# Patient Record
Sex: Male | Born: 1970 | Race: White | Hispanic: No | Marital: Married | Smoking: Never smoker
Health system: Southern US, Community
[De-identification: ages and names within clinical notes are randomized; demographics above are authoritative.]

## PROBLEM LIST (undated history)

## (undated) DIAGNOSIS — G4733 Obstructive sleep apnea (adult) (pediatric): Secondary | ICD-10-CM

## (undated) DIAGNOSIS — Z8711 Personal history of peptic ulcer disease: Secondary | ICD-10-CM

## (undated) DIAGNOSIS — G8929 Other chronic pain: Secondary | ICD-10-CM

## (undated) DIAGNOSIS — M549 Dorsalgia, unspecified: Secondary | ICD-10-CM

## (undated) DIAGNOSIS — N200 Calculus of kidney: Secondary | ICD-10-CM

## (undated) DIAGNOSIS — Z8719 Personal history of other diseases of the digestive system: Secondary | ICD-10-CM

## (undated) DIAGNOSIS — E785 Hyperlipidemia, unspecified: Secondary | ICD-10-CM

## (undated) HISTORY — DX: Obstructive sleep apnea (adult) (pediatric): G47.33

## (undated) HISTORY — DX: Dorsalgia, unspecified: M54.9

## (undated) HISTORY — PX: VASECTOMY: SHX75

## (undated) HISTORY — DX: Personal history of peptic ulcer disease: Z87.11

## (undated) HISTORY — PX: OTHER SURGICAL HISTORY: SHX169

## (undated) HISTORY — DX: Other chronic pain: G89.29

## (undated) HISTORY — DX: Hyperlipidemia, unspecified: E78.5

## (undated) HISTORY — DX: Personal history of other diseases of the digestive system: Z87.19

---

## 2005-08-23 ENCOUNTER — Encounter: Admission: RE | Admit: 2005-08-23 | Discharge: 2005-08-23 | Payer: Self-pay | Admitting: Unknown Physician Specialty

## 2010-01-12 ENCOUNTER — Ambulatory Visit: Payer: Self-pay | Admitting: Urology

## 2011-06-20 ENCOUNTER — Ambulatory Visit: Payer: Self-pay | Admitting: Emergency Medicine

## 2011-09-25 ENCOUNTER — Ambulatory Visit: Payer: Self-pay | Admitting: Emergency Medicine

## 2011-10-18 ENCOUNTER — Encounter: Payer: Self-pay | Admitting: Pulmonary Disease

## 2011-10-21 ENCOUNTER — Encounter: Payer: Self-pay | Admitting: Pulmonary Disease

## 2011-10-21 ENCOUNTER — Ambulatory Visit (INDEPENDENT_AMBULATORY_CARE_PROVIDER_SITE_OTHER): Payer: BC Managed Care – PPO | Admitting: Pulmonary Disease

## 2011-10-21 VITALS — BP 130/100 | HR 80 | Temp 98.5°F | Ht 69.0 in | Wt 189.0 lb

## 2011-10-21 DIAGNOSIS — G4733 Obstructive sleep apnea (adult) (pediatric): Secondary | ICD-10-CM

## 2011-10-21 NOTE — Assessment & Plan Note (Signed)
The patient has moderate central and obstructive sleep apnea by his recent sleep study, and this could easily explain his frequent awakenings and daytime symptoms.  However, it is unclear whether this is the cause of his sleep onset issues.  I have had a long discussion with him about sleep apnea, including its impact on his cardiovascular health and quality of life.  At this point, I wouldn't recommend aggressive treatment with CPAP, to see if he has significant improvement in his symptoms.  The patient is agreeable to this approach. I will set the patient up on cpap at a moderate pressure level to allow for desensitization, and will troubleshoot the device over the next 4-6weeks if needed.  The pt is to call me if having issues with tolerance.  Will then optimize the pressure once patient is able to wear cpap on a consistent basis.

## 2011-10-21 NOTE — Patient Instructions (Signed)
Will start on cpap at a moderate pressure level.  Please call if tolerance issues. followup with me in 5 weeks.

## 2011-10-21 NOTE — Progress Notes (Signed)
Subjective:    Patient ID: UNNAMED ZEIEN, male    DOB: 16-Aug-1971, 41 y.o.   MRN: [id number]  HPI The patient is a 41 year old male who been asked to see for management of obstructive sleep apnea.  He recently underwent nocturnal polysomnography, where he was found to have an AHI of 22 events per hour.  He underwent a recent CPAP titration, however an optimal pressure was never really reached.  The patient has had long-standing issues with sleep onset, and has been taking a sleep aid to help with this.  His wife has noted that he snores louder if he takes a sleeping pill.  She has also noted abnormal noises in his breathing during sleep, but has not commented specifically on an abnormal breathing pattern.  The patient has frequent awakenings at night, and is not usually rested in the mornings upon arising.  He also thinks that chronic back pain may be an issue for him, and will need to take pain medications at times.  The patient denies definite sleepiness during the day, but admits that his alertness and focus are not acceptable at times.  He denies any sleepiness in the evenings while watching television, and has no sleepiness with driving.  He states that his weight is neutral over the last 2 years.  Sleep Questionnaire: What time do you typically go to bed?( Between what hours) 10:30 to 11 pm How long does it take you to fall asleep? ? How many times during the night do you wake up? 5 What time do you get out of bed to start your day? 0630 Do you drive or operate heavy machinery in your occupation? No How much has your weight changed (up or down) over the past two years? (In pounds) 0 oz (0 kg) Have you ever had a sleep study before? Yes If yes, location of study? [facility] If yes, date of study? 06/2011 and 09/2011 Do you currently use CPAP? No Do you wear oxygen at any time? No    Review of Systems  Constitutional: Negative for fever and unexpected weight change.  HENT: Negative for ear pain,  nosebleeds, congestion, sore throat, rhinorrhea, sneezing, trouble swallowing, dental problem, postnasal drip and sinus pressure.   Eyes: Negative for redness and itching.  Respiratory: Negative for cough, chest tightness, shortness of breath and wheezing.   Cardiovascular: Negative for palpitations and leg swelling.  Gastrointestinal: Negative for nausea and vomiting.  Genitourinary: Negative for dysuria.  Musculoskeletal: Negative for joint swelling.  Skin: Negative for rash.  Neurological: Negative for headaches.  Hematological: Does not bruise/bleed easily.  Psychiatric/Behavioral: Negative for dysphoric mood. The patient is not nervous/anxious.        Objective:   Physical Exam Constitutional:  Well developed, no acute distress  HENT:  Right nostril patent without discharge, but deviated septum to left with partial obstruction  Oropharynx without exudate, palate and uvula are normal  Eyes:  Perrla, eomi, no scleral icterus  Neck:  No JVD, no TMG  Cardiovascular:  Normal rate, regular rhythm, no rubs or gallops.  No murmurs        Intact distal pulses  Pulmonary :  Normal breath sounds, no stridor or respiratory distress   No rales, rhonchi, or wheezing  Abdominal:  Soft, nondistended, bowel sounds present.  No tenderness noted.   Musculoskeletal:  No lower extremity edema noted.  Lymph Nodes:  No cervical lymphadenopathy noted  Skin:  No cyanosis noted  Neurologic:  Alert, appropriate, moves all 4  extremities without obvious deficit.         Assessment & Plan:

## 2011-11-25 ENCOUNTER — Ambulatory Visit (INDEPENDENT_AMBULATORY_CARE_PROVIDER_SITE_OTHER): Payer: BC Managed Care – PPO | Admitting: Pulmonary Disease

## 2011-11-25 ENCOUNTER — Encounter: Payer: Self-pay | Admitting: Pulmonary Disease

## 2011-11-25 VITALS — BP 136/92 | HR 81 | Temp 98.5°F | Ht 69.0 in | Wt 188.0 lb

## 2011-11-25 DIAGNOSIS — G4733 Obstructive sleep apnea (adult) (pediatric): Secondary | ICD-10-CM

## 2011-11-25 NOTE — Progress Notes (Signed)
  Subjective:    Patient ID: Donald Tran, male    DOB: Oct 27, 1970, 41 y.o.   MRN: [id number]  HPI The pt comes in today for f/u of his known osa.  He is wearing cpap compliantly, and reports no significant issues with mask fit or pressure.  He is unsure if this is helping his sleep, and is still having awakenings at night.  However, we have not optimized his pressure as of yet.    Review of Systems  Constitutional: Negative for fever and unexpected weight change.  HENT: Positive for congestion, sore throat, sneezing and sinus pressure. Negative for ear pain, nosebleeds, rhinorrhea, trouble swallowing, dental problem and postnasal drip.   Eyes: Negative for redness and itching.  Respiratory: Negative for cough, chest tightness, shortness of breath and wheezing.   Cardiovascular: Negative for palpitations and leg swelling.  Gastrointestinal: Negative for nausea and vomiting.  Genitourinary: Negative for dysuria.  Musculoskeletal: Negative for joint swelling.  Skin: Negative for rash.  Neurological: Positive for headaches.  Hematological: Does not bruise/bleed easily.  Psychiatric/Behavioral: Negative for dysphoric mood. The patient is not nervous/anxious.        Objective:   Physical Exam Wd male in and No skin breakdown or pressure necrosis from cpap mask LE without edema, no cyanosis  Alert, does not appear sleepy, moves all 4.        Assessment & Plan:

## 2011-11-25 NOTE — Patient Instructions (Signed)
Will optimize your pressure on auto setting for the next few weeks.  Will let you know the results. Hopefully, this will resolve your issue with nocturnal awakenings Ask your wife about kicking during the night If you are doing well on cpap, will see you back in 6mos.  If persistent symptoms, will need to discuss further.

## 2011-11-25 NOTE — Assessment & Plan Note (Signed)
The pt is wearing cpap compliantly, but continues to have awakenings during the night.  This may be related to break thru events since we have yet optimized his pressure.  Will do this on the auto setting for the next few weeks, and I will let him know his pressure.  Hopefully, this will resolve his awakenings at night.  I have also asked him to see if wife has noticed leg kicks during the night.  He did have leg jerks noted on his original sleep study.

## 2011-12-08 ENCOUNTER — Other Ambulatory Visit: Payer: Self-pay | Admitting: Pulmonary Disease

## 2011-12-23 ENCOUNTER — Other Ambulatory Visit: Payer: Self-pay | Admitting: Pulmonary Disease

## 2011-12-23 ENCOUNTER — Telehealth: Payer: Self-pay | Admitting: Pulmonary Disease

## 2011-12-23 DIAGNOSIS — G4733 Obstructive sleep apnea (adult) (pediatric): Secondary | ICD-10-CM

## 2011-12-23 NOTE — Telephone Encounter (Signed)
I spoke with patient about results and he verbalized understanding and had no questions. Pt aware order has been sent

## 2011-12-23 NOTE — Telephone Encounter (Signed)
Pt requesting results of download from week of 12-09-10. Cpap was placed on auto with download in2 weeks. Please advise.Carron Curie, CMA

## 2011-12-23 NOTE — Telephone Encounter (Signed)
Sent order to [facility] to set his machine on 11cm.

## 2011-12-26 ENCOUNTER — Emergency Department: Payer: Self-pay | Admitting: Emergency Medicine

## 2011-12-26 LAB — URINALYSIS, COMPLETE
Bilirubin,UR: NEGATIVE
Glucose,UR: NEGATIVE mg/dL (ref 0–75)
Leukocyte Esterase: NEGATIVE
Ph: 9 (ref 4.5–8.0)
Squamous Epithelial: NONE SEEN

## 2011-12-27 ENCOUNTER — Ambulatory Visit: Payer: Self-pay | Admitting: Urology

## 2012-04-15 ENCOUNTER — Telehealth: Payer: Self-pay | Admitting: Pulmonary Disease

## 2012-04-15 NOTE — Telephone Encounter (Signed)
Need specific reasons why he can't wear for more than 30 min (ie..mask issue, pressure issue, humidity issue, etc)

## 2012-04-15 NOTE — Telephone Encounter (Signed)
LMTCB

## 2012-04-15 NOTE — Telephone Encounter (Signed)
Spoke with patient-states in the beginning he was able to wear CPAP without any troubles. Now, he is unable to wear more than 30 minutes at night; Also, has hard time with tubing when moving around in the bed; I suggested patient contact [facility] to see about bedside pole to help keep tubing off of him at night. KC, please advise if any other suggestions. Thanks.

## 2012-04-15 NOTE — Telephone Encounter (Signed)
Spoke with pt and notified of recs per [facility]. He verbalized understanding and states nothing further needed. Will take CPAP by [facility] today.

## 2012-04-15 NOTE — Telephone Encounter (Signed)
I guess see if DME can help with tube issues, and see if gets better.  Have pt call us if continues.

## 2012-04-15 NOTE — Telephone Encounter (Signed)
KC, pt states that the tubing is in the way; no other explanations as to why he cant wear it. States no issues with pressure,etc.

## 2012-06-01 ENCOUNTER — Encounter: Payer: Self-pay | Admitting: Pulmonary Disease

## 2012-06-01 ENCOUNTER — Ambulatory Visit (INDEPENDENT_AMBULATORY_CARE_PROVIDER_SITE_OTHER): Payer: BC Managed Care – PPO | Admitting: Pulmonary Disease

## 2012-06-01 VITALS — BP 132/80 | HR 78 | Temp 98.3°F | Ht 69.0 in | Wt 187.6 lb

## 2012-06-01 DIAGNOSIS — G4733 Obstructive sleep apnea (adult) (pediatric): Secondary | ICD-10-CM

## 2012-06-01 NOTE — Patient Instructions (Addendum)
Take the mirapex prescribed by your primary (0.125mg ) after DINNER each night.  If helps, but only partially, increase to 2 after dinner. Let me know in 3 weeks about your response to treatment, and we can discuss further options from there.

## 2012-06-01 NOTE — Assessment & Plan Note (Signed)
The patient continues to have difficulty with CPAP compliance because of frequent awakenings during the night.  He feels very strongly that he does not awaken, he would wear CPAP the entirety of the night.  His sleep study did show moderate numbers of periodic limb movements, and his wife has noted this at home.  He has been given a prescription for Mirapex by his primary M.D., and I think he should try this to see if that helps with awakenings.  I have also stressed to him the importance of trying to avoid nightly sedative hypnotics in order to initiate and maintain sleep.  If he sees significant improvement in his sleep duration with a dopamine agonist, I would then reintroduce CPAP.  If he continues to have issues with tolerance, I would then consider a dental appliance.

## 2012-06-01 NOTE — Progress Notes (Signed)
  Subjective:    Patient ID: Donald Tran, male    DOB: 07-Jan-1971, 41 y.o.   MRN: [id number]  HPI The patient comes in today for followup of his known moderate obstructive sleep apnea.  His pressure has been optimized, but despite this, he continues to have issues with compliance.  He currently is not wearing CPAP, and attributes this to frequent awakenings during the night.  We had been unable to pinpoint the reason for this, but I had asked him at the last visit to discuss with his wife the possibility of limb movements.  He did have these on his sleep study, and his wife verified he does have these during the night.  He has been given a prescription for a dopamine agonist by his primary care physician, but has not started the medication.  He is continuing to take Lunesta almost nightly to try and initiate and maintain sleep, but he has been breaking the tablets up into quarters.   Review of Systems  Constitutional: Negative for fever and unexpected weight change.  HENT: Negative for ear pain, nosebleeds, congestion, sore throat, rhinorrhea, sneezing, trouble swallowing, dental problem, postnasal drip and sinus pressure.   Eyes: Negative for redness and itching.  Respiratory: Negative for cough, chest tightness, shortness of breath and wheezing.   Cardiovascular: Negative for palpitations and leg swelling.  Gastrointestinal: Negative for nausea and vomiting.  Genitourinary: Negative for dysuria.  Musculoskeletal: Negative for joint swelling.  Skin: Negative for rash.  Neurological: Negative for headaches.  Hematological: Does not bruise/bleed easily.  Psychiatric/Behavioral: Negative for dysphoric mood. The patient is not nervous/anxious.        Objective:   Physical Exam Well-developed male in no acute distress No skin breakdown or pressure necrosis from the CPAP mask Lower extremities without edema, no cyanosis Alert, does not appear to be sleepy, moves all 4  extremities.       Assessment & Plan:

## 2012-06-30 ENCOUNTER — Telehealth: Payer: Self-pay | Admitting: Pulmonary Disease

## 2012-06-30 NOTE — Telephone Encounter (Signed)
Spoke with patient.  He would like Dr. Shelle Iron to know that since starting Mirapex 0.125mg  for the restless leg symptoms, he is feeling much better.  Feels like medication has really helped him, he is getting much more sleep and has even come off Lunesta.  Nothing further needed from patient at this time.  Will forward to Dr. Shelle Iron.

## 2012-06-30 NOTE — Telephone Encounter (Signed)
ATC patient, no answer. LMOMTCB x1

## 2012-07-01 NOTE — Telephone Encounter (Signed)
Pt is aware that [facility] would like him to try to use his CPAP now that he is sleeping better. He knows to call us if he has any issues or questions.

## 2012-07-01 NOTE — Telephone Encounter (Signed)
LMOMTCB x 1 

## 2012-07-01 NOTE — Telephone Encounter (Signed)
Returning call can be reached at [phone].Donald Tran

## 2012-07-01 NOTE — Telephone Encounter (Signed)
Let pt know that I am glad this has helped.  I would like for him to try to get back on cpap now that he is sleeping better.  Let me know if issues.

## 2012-09-04 ENCOUNTER — Telehealth: Payer: Self-pay | Admitting: Pulmonary Disease

## 2012-09-04 NOTE — Telephone Encounter (Signed)
See if he would be willing to try cpap one more time on the auto setting.  Then if he failed, we would know this is not a viable therapy for him and could try dental appliance?  The other option is to discontinue cpap now and look into dental appliance.  See what pt thinks,

## 2012-09-04 NOTE — Telephone Encounter (Signed)
Called and spoke with pt He states that he can not tolerate the CPAP for for than approx 2 hours He states that he falls asleep fine (w/o any sleep aid) and RLS better on mirapex However, he wakes up 2 hrs later and can not go back to sleep until he takes off his mask He states "it's just awkward" Denied any problems with pressure and states he is using "half mask with nasal pillows" KC, please advise thanks!

## 2012-09-07 ENCOUNTER — Other Ambulatory Visit: Payer: Self-pay | Admitting: Pulmonary Disease

## 2012-09-07 DIAGNOSIS — G4733 Obstructive sleep apnea (adult) (pediatric): Secondary | ICD-10-CM

## 2012-09-07 NOTE — Telephone Encounter (Signed)
Pt returned call. Kathleen W Perdue  

## 2012-09-07 NOTE — Telephone Encounter (Signed)
I spoke with pt and he states he would be willing to try auto setting again and see how he does. Please advise KC thanks

## 2012-09-07 NOTE — Telephone Encounter (Signed)
Order sent to [facility].  

## 2012-09-07 NOTE — Telephone Encounter (Signed)
lmomtcb  

## 2012-09-08 NOTE — Telephone Encounter (Signed)
Called, spoke with pt. Informed him order was placed to have cpap set on auto, and he will receive call from DME to get this set up.  Advised to pls call back if he has any questions or concerns.  He verbalized understanding and voiced no further questions or concerns at this time.

## 2012-11-13 ENCOUNTER — Telehealth: Payer: Self-pay | Admitting: Pulmonary Disease

## 2012-11-13 NOTE — Telephone Encounter (Signed)
LMTCB

## 2012-11-13 NOTE — Telephone Encounter (Signed)
Pt returned triage's call & can be reached after 4:30 today.  Antionette Fairy

## 2012-11-16 NOTE — Telephone Encounter (Signed)
Spoke with pt He states unable to use CPAP He denies any issue with mask, or pressure He states that he thinks it is b/c he is a light sleeper and before he always took some sort of sleep aid to help He tried using machine only a few nights, and could not ever fall asleep due to the simple fact that he had the machine on KC, please advise recs thanks!

## 2012-11-16 NOTE — Telephone Encounter (Signed)
LMTCB

## 2012-11-16 NOTE — Telephone Encounter (Signed)
I don't mind given him a two week treatment of something to help with sleep onset, but he cannot stay on it. Trazodone 50mg  one about an hour before bedtime.  #14, no fills.

## 2012-11-16 NOTE — Telephone Encounter (Signed)
Pt returned call. Donald Tran  

## 2012-11-17 MED ORDER — TRAZODONE HCL 50 MG PO TABS: ORAL_TABLET | ORAL | Status: DC

## 2012-11-17 NOTE — Telephone Encounter (Signed)
Spoke with pt and informed of Dr Shelle Iron instructions regarding sleep aid and that med was sent to pharmacy.

## 2012-11-20 ENCOUNTER — Telehealth: Payer: Self-pay | Admitting: Pulmonary Disease

## 2012-11-20 NOTE — Telephone Encounter (Signed)
Let him know that I do NOT prescribe ambien or lunesta in my practice except on rare occasions.  These are not good medications.   He needs to stay on his meds for his restless legs, and let us know if this is not completely controlled He also needs to stay on cpap.  Both of these conditions can cause insomnia. Finally, he may just have insomnia, and this is best treated with behavioral therapy from a psychologist instead of sleeping medications that lead to addiction.  It sounds like he would benefit from an ov to discuss some of this.

## 2012-11-20 NOTE — Telephone Encounter (Signed)
Pt states that since starting Trazodone he hasn't slept at all. He is wanting to switch back to [facility] or Ambien. Pt did mention that Ambien is cheaper and would like to try that one first.  Please advise KC. Thanks.

## 2012-11-20 NOTE — Telephone Encounter (Signed)
Patient states that he stopped his RLS meds a few weeks ago and is wanting to use Trazodone that he has from previous visit with [facility]. Per KC, no Trazodone at this time. Restart Mirapex--stay on CPAP If any breakthru symptoms let us know.  Pt aware of recs per [facility] about further treatment of insomnia/RLS Patient scheduled for ROV 11/24/12 at 915

## 2012-11-24 ENCOUNTER — Telehealth: Payer: Self-pay | Admitting: Pulmonary Disease

## 2012-11-24 ENCOUNTER — Encounter: Payer: Self-pay | Admitting: Pulmonary Disease

## 2012-11-24 ENCOUNTER — Ambulatory Visit (INDEPENDENT_AMBULATORY_CARE_PROVIDER_SITE_OTHER): Payer: BC Managed Care – PPO | Admitting: Pulmonary Disease

## 2012-11-24 VITALS — BP 128/72 | HR 71 | Temp 98.2°F | Ht 69.0 in | Wt 193.6 lb

## 2012-11-24 DIAGNOSIS — G4733 Obstructive sleep apnea (adult) (pediatric): Secondary | ICD-10-CM

## 2012-11-24 NOTE — Progress Notes (Signed)
  Subjective:    Patient ID: Donald Tran, male    DOB: Aug 19, 1971, 42 y.o.   MRN: [id number]  HPI Patient comes in today for followup of his obstructive sleep apnea and restless leg syndrome.  He has been having difficulties tolerating CPAP, and also sleeping without Ambien or Lunesta.  At the last visit, he was to take his Mirapex for his RLS, while wearing CPAP, and had issues with CPAP tolerance.  He was placed on the automatic setting, but continued to have issues.  He was given a short trial of trazodone, but for some reason he stopped taking his Mirapex, and still could not sleep with CPAP.  He comes in today where he is not able to wear his CPAP consistently, but I had instructed him by telephone to get back on his Mirapex.  His limb movements were completely controlled on this medication.  Despite this, he continues to have issues with CPAP, and believes it is the actual mask on his face that is the issue.  He is currently using nasal pillows.   Review of Systems  Constitutional: Negative for fever and unexpected weight change.  HENT: Negative for ear pain, nosebleeds, congestion, sore throat, rhinorrhea, sneezing, trouble swallowing, dental problem, postnasal drip and sinus pressure.   Eyes: Negative for redness and itching.  Respiratory: Negative for cough, chest tightness, shortness of breath and wheezing.   Cardiovascular: Negative for palpitations and leg swelling.  Gastrointestinal: Negative for nausea and vomiting.  Genitourinary: Negative for dysuria.  Musculoskeletal: Negative for joint swelling.  Skin: Negative for rash.  Neurological: Negative for headaches.  Hematological: Does not bruise/bleed easily.  Psychiatric/Behavioral: Negative for sleep disturbance ( improvement in RLS with treatment of Mirapex) and dysphoric mood. The patient is not nervous/anxious.        Objective:   Physical Exam Overweight male in no acute distress Nose without purulent discharge  noted No skin breakdown or pressure necrosis from the CPAP mask Neck without thyromegaly or lymphadenopathy Lower extremities without edema, cyanosis Alert and oriented, moves all 4 extremities.       Assessment & Plan:

## 2012-11-24 NOTE — Patient Instructions (Addendum)
Stay on cpap with the auto setting Stay on mirapex since it is helping your limb movements. Will try 2 weeks of trazodone while staying on mirapex and cpap. Please call me in 2 weeks with update.

## 2012-11-24 NOTE — Telephone Encounter (Signed)
Spoke with pt He is asking if needs to keep appt today with [facility] I advised yes, that way [facility] can answer all of his questions and they can talk about OSA tx since he is doing poorly with CPAP Pt verbalized understanding and will keep the appt today Nothing further needed

## 2012-11-24 NOTE — Assessment & Plan Note (Signed)
The patient continues to have issues with wearing CPAP, but did not stay on his medication for RLS while he was using trazodone to help him with CPAP tolerance.  He is now back on his Mirapex, and sees a big difference in his leg movement and sleep.  However, he is not able to tolerate CPAP through the night despite this.  Will try trazodone again while he is taking Mirapex and staying on CPAP.  If this does not work, we may have to consider going to a dental appliance.

## 2012-12-14 ENCOUNTER — Telehealth: Payer: Self-pay | Admitting: Pulmonary Disease

## 2012-12-14 MED ORDER — TRAZODONE HCL 50 MG PO TABS: 50.0000 mg | ORAL_TABLET | Freq: Every day | ORAL | Status: DC

## 2012-12-14 NOTE — Telephone Encounter (Signed)
Pt is aware of KC recommendation. Rx has been sent in.

## 2012-12-14 NOTE — Telephone Encounter (Signed)
Patient returning call.

## 2012-12-14 NOTE — Telephone Encounter (Signed)
I spoke with pt and he stated he tried the trazodone w/ the mirapex x 2 weeks. He was actually able to sleep with his CPAP x 7-8 hrs a night. He finished the trazodone last Tuesday. Since he has not been able to tolerate the CPAP. Only have been able to use it x1-3.5 hrs a night. Please advise KC thanks

## 2012-12-14 NOTE — Telephone Encounter (Signed)
LMTCB

## 2012-12-14 NOTE — Telephone Encounter (Signed)
I am willing to give him a little longer on trazodone to adapt further to cpap.  Ok to call in trazodone 50mg  one at hs prn  #14, no fills. Tell the pt he needs to keep trying the cpap off a sleeping medication (but on mirapex))

## 2013-01-21 ENCOUNTER — Telehealth: Payer: Self-pay | Admitting: Pulmonary Disease

## 2013-01-21 DIAGNOSIS — G4733 Obstructive sleep apnea (adult) (pediatric): Secondary | ICD-10-CM

## 2013-01-21 NOTE — Telephone Encounter (Signed)
Spoke with pt He states that he is still unable to tolerate CPAP despite taking trazodone He can fall asleep, but not with his CPAP on Please advise if any further recs thanks

## 2013-01-22 NOTE — Telephone Encounter (Signed)
Spoke with patient, made him aware of recs per [facility] Patient verbalized understanding and would like to proceed with dental appliance Order has been placed, patient aware and nothing further needed at this time

## 2013-01-22 NOTE — Telephone Encounter (Signed)
Let pt know I suspect cpap is not going to be a viable therapy for him.  Can consider dental appliance or surgery, in addition to weight loss.  Would be happy to see him and discuss other options.  If he would like to consider appliance or surgery, can send to dental medicine or ENT.

## 2013-02-17 ENCOUNTER — Telehealth: Payer: Self-pay | Admitting: Pulmonary Disease

## 2013-02-17 NOTE — Telephone Encounter (Signed)
Type Date User   General 01/22/2013 10:38 AM COBB, RHONDA J        Note    Referral faxed to Dr. Althea Grimmer office requesting appointment to be evaluated for oral dental appliance for sleep apnea. [facility] [address] [place], [place], [place] [address]. Phone # [phone]. Requested that Dr. Myrtis Ser office contact patient to arrange appointment. Rhonda J Cobb   I called and made pt aware. Nothing further was needed

## 2013-09-21 ENCOUNTER — Other Ambulatory Visit: Payer: Self-pay | Admitting: Pulmonary Disease

## 2014-09-19 ENCOUNTER — Other Ambulatory Visit: Payer: Self-pay | Admitting: Pulmonary Disease

## 2015-06-16 ENCOUNTER — Encounter: Payer: Self-pay | Admitting: Internal Medicine

## 2015-06-16 ENCOUNTER — Ambulatory Visit (INDEPENDENT_AMBULATORY_CARE_PROVIDER_SITE_OTHER): Payer: BLUE CROSS/BLUE SHIELD | Admitting: Internal Medicine

## 2015-06-16 VITALS — BP 142/90 | HR 80 | Ht 69.0 in | Wt 197.8 lb

## 2015-06-16 DIAGNOSIS — G4733 Obstructive sleep apnea (adult) (pediatric): Secondary | ICD-10-CM | POA: Diagnosis not present

## 2015-06-16 NOTE — Progress Notes (Signed)
11/24/12- Dr Shelle Ironlance HPI Patient comes in today for followup of his obstructive sleep apnea and restless leg syndrome. He has been having difficulties tolerating CPAP, and also sleeping without Ambien or Lunesta. At the last visit, he was to take his Mirapex for his RLS, while wearing CPAP, and had issues with CPAP tolerance. He was placed on the automatic setting, but continued to have issues. He was given a short trial of trazodone, but for some reason he stopped taking his Mirapex, and still could not sleep with CPAP. He comes in today where he is not able to wear his CPAP consistently, but I had instructed him by telephone to get back on his Mirapex. His limb movements were completely controlled on this medication. Despite this, he continues to have issues with CPAP, and believes it is the actual mask on his face that is the issue. He is currently using nasal pillows.  06/16/15- 44 yom followed for OSA, insomnia, ? Restless legs Former [facility] patient  NPSG 06/20/11- AHI 22.1/ hr, obstructive and central apneas, desaturation to 88.6%, limb movements without arousal, weight 185 pounds-study done at [facility] Original complaint was restless sleep with frequent awakenings. He struggled with CPAP but could not tolerate anything on his face, including nasal pillows mask. Little daytime sleepiness or snoring. Wife does not reported significant limb movement. Mirapex was tried because of limb movements recorded on the sleep study but has not made a difference. He does continue it. He tried positional therapy with a "Slumber Bump "but it aggravated his insomnia. Tried trazodone, Lunesta, Ambien, melatonin. These tended to leave him sleepier in the daytime. He has been working with Dr. Rosalita ChessmanKatz/Orthodontics using an oral appliance for sleep apnea. This seemed to displace his teeth and affect his bite. Apnea link evaluations with oral appliance in place show improved AHI around 7.  ROS-see HPI   Negative unless  "+" Constitutional:    weight loss, night sweats, fevers, chills, fatigue, lassitude. HEENT:    headaches, difficulty swallowing, tooth/dental problems, sore throat,       sneezing, itching, ear ache, nasal congestion, post nasal drip, snoring CV:    chest pain, orthopnea, PND, swelling in lower extremities, anasarca,                                                 dizziness, palpitations Resp:   shortness of breath with exertion or at rest.                productive cough,   non-productive cough, coughing up of blood.              change in color of mucus.  wheezing.   Skin:    rash or lesions. GI:  No-   heartburn, indigestion, abdominal pain, nausea, vomiting,  GU:  MS:   joint pain, stiffness. Neuro-     nothing unusual Psych:  change in mood or affect.  depression or anxiety.   memory loss.  OBJ- Physical Exam General- Alert, Oriented, Affect-appropriate, Distress- none acute, trim, intelligent gentleman Skin- rash-none, lesions- none, excoriation- none Lymphadenopathy- none Head- atraumatic            Eyes- Gross vision intact, PERRLA, conjunctivae and secretions clear            Ears- Hearing, canals-normal  Nose- Clear, no-Septal dev, mucus, polyps, erosion, perforation             Throat- Mallampati II , mucosa clear , drainage- none, tonsils- atrophic Neck- flexible , trachea midline, no stridor , thyroid nl, carotid no bruit Chest - symmetrical excursion , unlabored           Heart/CV- RRR , no murmur , no gallop  , no rub, nl s1 s2                           - JVD- none , edema- none, stasis changes- none, varices- none           Lung- clear to P&A, wheeze- none, cough- none , dullness-none, rub- none           Chest wall-  Abd-  Br/ Gen/ Rectal- Not done, not indicated Extrem- cyanosis- none, clubbing, none, atrophy- none, strength- nl Neuro- grossly intact to observation

## 2015-06-16 NOTE — Patient Instructions (Addendum)
Order- unattended home sleep test   Dx OSA    To be done on Mirapex, without oral appliance  We will talk about options going forward once we have seen this  We will want to schedule back to go over results within 2-3 weeks after your home study

## 2015-06-16 NOTE — Assessment & Plan Note (Signed)
His original sleep study showed mixed obstructive and central apneas. His observations and complaint focus primarily on interrupted, restless sleep-insomnia, without daytime sleepiness and snoring or witnessed leg jerks. He didn't tolerate sleep medications tried. He didn't tolerate CPAP because of disturbance from several masks. He has dialed his oral appliance back from 9-4. He does not look like a surgical candidate. He would like reassessment. He does not think he sleeps normally with the monitoring constraints of attended sleep study. Plan-schedule unattended home sleep test without his oral appliance but using Mirapex at his request. He will sleep in positions of comfort.

## 2015-06-28 DIAGNOSIS — G4733 Obstructive sleep apnea (adult) (pediatric): Secondary | ICD-10-CM | POA: Diagnosis not present

## 2015-06-30 DIAGNOSIS — G4733 Obstructive sleep apnea (adult) (pediatric): Secondary | ICD-10-CM | POA: Diagnosis not present

## 2015-07-03 ENCOUNTER — Other Ambulatory Visit: Payer: Self-pay | Admitting: *Deleted

## 2015-07-03 DIAGNOSIS — G4733 Obstructive sleep apnea (adult) (pediatric): Secondary | ICD-10-CM

## 2015-07-12 ENCOUNTER — Ambulatory Visit (INDEPENDENT_AMBULATORY_CARE_PROVIDER_SITE_OTHER): Payer: BLUE CROSS/BLUE SHIELD | Admitting: Internal Medicine

## 2015-07-12 ENCOUNTER — Encounter: Payer: Self-pay | Admitting: Internal Medicine

## 2015-07-12 VITALS — BP 128/82 | HR 74 | Ht 69.0 in | Wt 203.0 lb

## 2015-07-12 DIAGNOSIS — G4700 Insomnia, unspecified: Secondary | ICD-10-CM

## 2015-07-12 DIAGNOSIS — G4733 Obstructive sleep apnea (adult) (pediatric): Secondary | ICD-10-CM | POA: Diagnosis not present

## 2015-07-12 NOTE — Progress Notes (Signed)
11/24/12- Dr Shelle Iron HPI Patient comes in today for followup of his obstructive sleep apnea and restless leg syndrome. He has been having difficulties tolerating CPAP, and also sleeping without Ambien or Lunesta. At the last visit, he was to take his Mirapex for his RLS, while wearing CPAP, and had issues with CPAP tolerance. He was placed on the automatic setting, but continued to have issues. He was given a short trial of trazodone, but for some reason he stopped taking his Mirapex, and still could not sleep with CPAP. He comes in today where he is not able to wear his CPAP consistently, but I had instructed him by telephone to get back on his Mirapex. His limb movements were completely controlled on this medication. Despite this, he continues to have issues with CPAP, and believes it is the actual mask on his face that is the issue. He is currently using nasal pillows.  06/16/15- 44 yom followed for OSA, insomnia, ? Restless legs Former [facility] patient  NPSG 06/20/11- AHI 22.1/ hr, obstructive and central apneas, desaturation to 88.6%, limb movements without arousal, weight 185 pounds-study done at [facility] Original complaint was restless sleep with frequent awakenings. He struggled with CPAP but could not tolerate anything on his face, including nasal pillows mask. Little daytime sleepiness or snoring. Wife does not reported significant limb movement. Mirapex was tried because of limb movements recorded on the sleep study but has not made a difference. He does continue it. He tried positional therapy with a "Slumber Bump "but it aggravated his insomnia. Tried trazodone, Lunesta, Ambien, melatonin. These tended to leave him sleepier in the daytime. He has been working with Dr. Rosalita Chessman using an oral appliance for sleep apnea. This seemed to displace his teeth and affect his bite. Apnea link evaluations with oral appliance in place show improved AHI around 7.  07/12/2015-44 year old male  followed for OSA, insomnia,? Restless legs HST 06/28/15- moderate OSA, AHI 21.6/ hr, desat to 87%, weight 197.5 lbs. Took Mirapex but did not wear oral appliance Follows For: pt here for review of HST. no concerns at this time. We discussed results of his recent home sleep test which scored very close to his original diagnostic polysomnogram in 2012. He is not overweight. He is prone to insomnia and very intolerant of anything on his face. He had made significant effort after his original sleep study to find a CPAP mask he could tolerate without much success. He dislikes the impression with his current oral appliances that his bite is being deformed. He has a Radio broadcast assistant with an oral appliance from a different provider and I suggested he look into that. We also discussed the possibility that a surgical opinion might be useful to him.  ROS-see HPI   Negative unless "+" Constitutional:    weight loss, night sweats, fevers, chills, fatigue, lassitude. HEENT:    headaches, difficulty swallowing, tooth/dental problems, sore throat,       sneezing, itching, ear ache, nasal congestion, post nasal drip, snoring CV:    chest pain, orthopnea, PND, swelling in lower extremities, anasarca,                                                 dizziness, palpitations Resp:   shortness of breath with exertion or at rest.  productive cough,   non-productive cough, coughing up of blood.              change in color of mucus.  wheezing.   Skin:    rash or lesions. GI:  No-   heartburn, indigestion, abdominal pain, nausea, vomiting,  GU:  MS:   joint pain, stiffness. Neuro-     nothing unusual Psych:  change in mood or affect.  depression or anxiety.   memory loss.  OBJ- Physical Exam General- Alert, Oriented, Affect-appropriate, Distress- none acute, trim, intelligent gentleman Skin- rash-none, lesions- none, excoriation- none Lymphadenopathy- none Head- atraumatic            Eyes- Gross vision intact,  PERRLA, conjunctivae and secretions clear            Ears- Hearing, canals-normal            Nose- Clear, no-Septal dev, mucus, polyps, erosion, perforation             Throat- Mallampati III , mucosa clear , drainage- none, tonsils- atrophic Neck- flexible , trachea midline, no stridor , thyroid nl, carotid no bruit Chest - symmetrical excursion , unlabored           Heart/CV- RRR , no murmur , no gallop  , no rub, nl s1 s2                           - JVD- none , edema- none, stasis changes- none, varices- none           Lung- clear to P&A, wheeze- none, cough- none , dullness-none, rub- none           Chest wall-  Abd-  Br/ Gen/ Rectal- Not done, not indicated Extrem- cyanosis- none, clubbing, none, atrophy- none, strength- nl Neuro- grossly intact to observation

## 2015-07-12 NOTE — Patient Instructions (Addendum)
Your home sleep study does again demonstrate moderately severe obstructive sleep apnea.   Options-  We can try again with CPAP  Ok to try Cognitive Behavioral Therapy to help with insomnia and to help you settle into being able to sleep better with CPAP  (eg. [facility];  Drs Ledon SnareMcKnight and Delton SeeNelson,  [phone])  Ok to discuss alternative oral appliances with Dr Myrtis SerKatz, or to see another provider about alternative devices.  Ok to talk with an ENT about surgical approaches to treating obstructive sleep apnea  Please call as needed

## 2015-07-14 DIAGNOSIS — G4700 Insomnia, unspecified: Secondary | ICD-10-CM | POA: Insufficient documentation

## 2015-07-14 NOTE — Assessment & Plan Note (Signed)
He failed to tolerate CPAP despite significant effort. His current experience with oral appliance has been uncomfortable because it deforms his bite. He admits he is prone to insomnia and very sensitive to external stimuli such as mask on his face. Plan-okay to explore other masks, other oral appliances, surgery as an option to learn about

## 2015-07-14 NOTE — Assessment & Plan Note (Signed)
He asked about cognitive behavioral therapy which we discussed as an option. This approach might also reduce his sensitivity to external stimuli such as CPAP masks and oral appliances.

## 2015-07-15 ENCOUNTER — Emergency Department
Admission: EM | Admit: 2015-07-15 | Discharge: 2015-07-15 | Disposition: A | Discharge status: Home / Self Care | Payer: BLUE CROSS/BLUE SHIELD | Attending: Emergency Medicine | Admitting: Emergency Medicine

## 2015-07-15 DIAGNOSIS — R109 Unspecified abdominal pain: Secondary | ICD-10-CM | POA: Diagnosis present

## 2015-07-15 DIAGNOSIS — Z79899 Other long term (current) drug therapy: Secondary | ICD-10-CM | POA: Insufficient documentation

## 2015-07-15 DIAGNOSIS — N200 Calculus of kidney: Secondary | ICD-10-CM | POA: Insufficient documentation

## 2015-07-15 HISTORY — DX: Calculus of kidney: N20.0

## 2015-07-15 LAB — BASIC METABOLIC PANEL
ANION GAP: 10 (ref 5–15)
BUN: 16 mg/dL (ref 6–20)
CALCIUM: 9.8 mg/dL (ref 8.9–10.3)
CO2: 26 mmol/L (ref 22–32)
Chloride: 102 mmol/L (ref 101–111)
Creatinine, Ser: 1.27 mg/dL — ABNORMAL HIGH (ref 0.61–1.24)
GFR calc non Af Amer: >60 mL/min (ref >60)
Glucose, Bld: 134 mg/dL — ABNORMAL HIGH (ref 65–99)
Potassium: 4.1 mmol/L (ref 3.5–5.1)
SODIUM: 138 mmol/L (ref 135–145)

## 2015-07-15 LAB — CBC
HCT: 46.7 % (ref 40.0–52.0)
HEMOGLOBIN: 16.1 g/dL (ref 13.0–18.0)
MCH: 31.3 pg (ref 26.0–34.0)
MCHC: 34.5 g/dL (ref 32.0–36.0)
MCV: 90.9 fL (ref 80.0–100.0)
PLATELETS: 209 10*3/uL (ref 150–440)
RBC: 5.13 MIL/uL (ref 4.40–5.90)
RDW: 12.7 % (ref 11.5–14.5)
WBC: 8.8 10*3/uL (ref 3.8–10.6)

## 2015-07-15 LAB — URINALYSIS COMPLETE WITH MICROSCOPIC (ARMC ONLY)
BILIRUBIN URINE: NEGATIVE
Bacteria, UA: NONE SEEN
Glucose, UA: 50 mg/dL — AB
LEUKOCYTES UA: NEGATIVE
NITRITE: NEGATIVE
PH: 5 (ref 5.0–8.0)
PROTEIN: 100 mg/dL — AB
SPECIFIC GRAVITY, URINE: 1.029 (ref 1.005–1.030)

## 2015-07-15 MED ORDER — OXYCODONE-ACETAMINOPHEN 5-325 MG PO TABS: 1.0000 | ORAL_TABLET | Freq: Four times a day (QID) | ORAL | Status: DC | PRN

## 2015-07-15 MED ORDER — KETOROLAC TROMETHAMINE 10 MG PO TABS: 10.0000 mg | ORAL_TABLET | Freq: Four times a day (QID) | ORAL | Status: DC | PRN

## 2015-07-15 MED ORDER — TAMSULOSIN HCL 0.4 MG PO CAPS: 0.8000 mg | ORAL_CAPSULE | Freq: Every day | ORAL | Status: DC

## 2015-07-15 MED ORDER — ONDANSETRON 4 MG PO TBDP: 4.0000 mg | ORAL_TABLET | Freq: Three times a day (TID) | ORAL | Status: DC | PRN

## 2015-07-15 MED ORDER — HYDROMORPHONE HCL 1 MG/ML IJ SOLN
1.0000 mg | Freq: Once | INTRAMUSCULAR | Status: AC
  Administered 2015-07-15: 1 mg via INTRAVENOUS
  Filled 2015-07-15: qty 1

## 2015-07-15 MED ORDER — KETOROLAC TROMETHAMINE 30 MG/ML IJ SOLN
30.0000 mg | Freq: Once | INTRAMUSCULAR | Status: AC
  Administered 2015-07-15: 30 mg via INTRAVENOUS
  Filled 2015-07-15: qty 1

## 2015-07-15 MED ORDER — ONDANSETRON HCL 4 MG/2ML IJ SOLN
4.0000 mg | Freq: Once | INTRAMUSCULAR | Status: AC
  Administered 2015-07-15: 4 mg via INTRAVENOUS
  Filled 2015-07-15: qty 2

## 2015-07-15 NOTE — ED Notes (Addendum)
Severe rt flank pain onset 0700 this morning.  Blood in urine this am, cleared now. Took flomax and hydrocodone (meds from previous kidney stone - 2 years ago)

## 2015-07-15 NOTE — ED Notes (Signed)
Labs reviewed, consult PA, moved to flex wait

## 2015-07-15 NOTE — ED Provider Notes (Signed)
[facility] Emergency Department Provider Note  ____________________________________________  Time seen: Approximately 12:29 PM  I have reviewed the triage vital signs and the nursing notes.   HISTORY  Chief Complaint Flank Pain    HPI Donald Tran is a 44 y.o. male resents emergency department complaining of severe right flank pain starting this morning around 0700 hrs. Per the patient he has an extensive history of kidney stones and states that symptoms are the same. He didn't notice hematuria this morning that resolved periodically has returned. He states that he has taken Flomax and hydrocodone from previous kidney stone. Patient states that he has increased his oral intake of fluids and is still urinating appropriately. He states pain is constant, sharp, radiating from flank to groin.   Past Medical History  Diagnosis Date  . OSA (obstructive sleep apnea)   . Chronic back pain   . Hyperlipidemia   . Kidney stones   . Kidney stones     Patient Active Problem List   Diagnosis Date Noted  . Insomnia 07/14/2015  . OSA (obstructive sleep apnea) 10/21/2011    Past Surgical History  Procedure Laterality Date  . Vasectomy      Current Outpatient Rx  Name  Route  Sig  Dispense  Refill  . cetirizine (ZYRTEC) 10 MG tablet   Oral   Take 10 mg by mouth daily.         Marland Kitchen ketorolac (TORADOL) 10 MG tablet   Oral   Take 1 tablet (10 mg total) by mouth every 6 (six) hours as needed.   20 tablet   0   . ondansetron (ZOFRAN-ODT) 4 MG disintegrating tablet   Oral   Take 1 tablet (4 mg total) by mouth every 8 (eight) hours as needed for nausea or vomiting.   20 tablet   0   . oxyCODONE-acetaminophen (ROXICET) 5-325 MG tablet   Oral   Take 1-2 tablets by mouth every 6 (six) hours as needed for severe pain.   20 tablet   0   . pramipexole (MIRAPEX) 0.125 MG tablet   Oral   Take 0.125 mg by mouth at bedtime.         . simvastatin (ZOCOR)  40 MG tablet   Oral   Take 40 mg by mouth daily.         . tamsulosin (FLOMAX) 0.4 MG CAPS capsule   Oral   Take 2 capsules (0.8 mg total) by mouth daily.   30 capsule   0     Allergies Review of patient's allergies indicates no known allergies.  History reviewed. No pertinent family history.  Social History Social History  Substance Use Topics  . Smoking status: Never Smoker   . Smokeless tobacco: None  . Alcohol Use: No    Review of Systems Constitutional: No fever/chills Eyes: No visual changes. ENT: No sore throat. Cardiovascular: Denies chest pain. Respiratory: Denies shortness of breath. Gastrointestinal: No abdominal pain.  No nausea, no vomiting.  No diarrhea.  No constipation. Genitourinary: Negative for dysuria. Dorsal hematuria. Endorses flank pain. Musculoskeletal: Negative for back pain. Skin: Negative for rash. Neurological: Negative for headaches, focal weakness or numbness.  10-point ROS otherwise negative.  ____________________________________________   PHYSICAL EXAM:  VITAL SIGNS: ED Triage Vitals  Enc Vitals Group     BP 07/15/15 1025 129/73 mmHg     Pulse Rate 07/15/15 1025 79     Resp 07/15/15 1025 18     Temp 07/15/15  1042 98.1 F (36.7 C)     Temp Source 07/15/15 1042 Oral     SpO2 07/15/15 1025 100 %     Weight 07/15/15 1025 195 lb (88.451 kg)     Height 07/15/15 1025 5\' 9"  (1.753 m)     Head Cir --      Peak Flow --      Pain Score 07/15/15 1216 7     Pain Loc --      Pain Edu? --      Excl. in GC? --     Constitutional: Alert and oriented. Well appearing and in no acute distress. Eyes: Conjunctivae are normal. PERRL. EOMI. Head: Atraumatic. Nose: No congestion/rhinnorhea. Mouth/Throat: Mucous membranes are moist.  Oropharynx non-erythematous. Neck: No stridor.   Cardiovascular: Normal rate, regular rhythm. Grossly normal heart sounds.  Good peripheral circulation. Respiratory: Normal respiratory effort.  No  retractions. Lungs CTAB. Gastrointestinal: Soft and nontender. No distention. No abdominal bruits. No CVA tenderness. Musculoskeletal: No lower extremity tenderness nor edema.  No joint effusions. Neurologic:  Normal speech and language. No gross focal neurologic deficits are appreciated. No gait instability. Skin:  Skin is warm, dry and intact. No rash noted. Psychiatric: Mood and affect are normal. Speech and behavior are normal.  ____________________________________________   LABS (all labs ordered are listed, but only abnormal results are displayed)  Labs Reviewed  BASIC METABOLIC PANEL - Abnormal; Notable for the following:    Glucose, Bld 134 (*)    Creatinine, Ser 1.27 (*)    All other components within normal limits  URINALYSIS COMPLETEWITH MICROSCOPIC ([facility] ONLY) - Abnormal; Notable for the following:    Color, Urine YELLOW (*)    APPearance HAZY (*)    Glucose, UA 50 (*)    Ketones, ur TRACE (*)    Hgb urine dipstick 3+ (*)    Protein, ur 100 (*)    Squamous Epithelial / LPF 0-5 (*)    All other components within normal limits  CBC   ____________________________________________  EKG   ____________________________________________  RADIOLOGY   ____________________________________________   PROCEDURES  Procedure(s) performed: None  Critical Care performed: No  ____________________________________________   INITIAL IMPRESSION / ASSESSMENT AND PLAN / ED COURSE  Pertinent labs & imaging results that were available during my care of the patient were reviewed by me and considered in my medical decision making (see chart for details).  Patient's history, symptoms, physical exam are consistent with kidney stone. Advised patient of findings and diagnosis. Discussed the benefits and risks of imaging. Patient works with a source of radiation every day and declines CT scan at this time. Strict ED precautions are given to patient to return for increase in pain,  decrease in urinary output, fevers or chills, nausea or vomiting. Patient verbalizes understanding of the diagnosis and treatment plan verbalizes compliance to same. Patient will be discharged home with narcotics, Flomax, Zofran, and anti-inflammatories. ____________________________________________   FINAL CLINICAL IMPRESSION(S) / ED DIAGNOSES  Final diagnoses:  Kidney stones, calcium oxalate      Racheal PatchesJonathan D Doug Bucklin, PA-C 07/15/15 1328  Arnaldo NatalPaul F Malinda, MD 07/15/15 [phone]

## 2015-07-15 NOTE — Discharge Instructions (Signed)
Kidney Stones °Kidney stones (urolithiasis) are deposits that form inside your kidneys. The intense pain is caused by the stone moving through the urinary tract. When the stone moves, the ureter goes into spasm around the stone. The stone is usually passed in the urine.  °CAUSES  °· A disorder that makes certain neck glands produce too much parathyroid hormone (primary hyperparathyroidism). °· A buildup of uric acid crystals, similar to gout in your joints. °· Narrowing (stricture) of the ureter. °· A kidney obstruction present at birth (congenital obstruction). °· Previous surgery on the kidney or ureters. °· Numerous kidney infections. °SYMPTOMS  °· Feeling sick to your stomach (nauseous). °· Throwing up (vomiting). °· Blood in the urine (hematuria). °· Pain that usually spreads (radiates) to the groin. °· Frequency or urgency of urination. °DIAGNOSIS  °· Taking a history and physical exam. °· Blood or urine tests. °· CT scan. °· Occasionally, an examination of the inside of the urinary bladder (cystoscopy) is performed. °TREATMENT  °· Observation. °· Increasing your fluid intake. °· Extracorporeal shock wave lithotripsy--This is a noninvasive procedure that uses shock waves to break up kidney stones. °· Surgery may be needed if you have severe pain or persistent obstruction. There are various surgical procedures. Most of the procedures are performed with the use of small instruments. Only small incisions are needed to accommodate these instruments, so recovery time is minimized. °The size, location, and chemical composition are all important variables that will determine the proper choice of action for you. Talk to your health care provider to better understand your situation so that you will minimize the risk of injury to yourself and your kidney.  °HOME CARE INSTRUCTIONS  °· Drink enough water and fluids to keep your urine clear or pale yellow. This will help you to pass the stone or stone fragments. °· Strain  all urine through the provided strainer. Keep all particulate matter and stones for your health care provider to see. The stone causing the pain may be as small as a grain of salt. It is very important to use the strainer each and every time you pass your urine. The collection of your stone will allow your health care provider to analyze it and verify that a stone has actually passed. The stone analysis will often identify what you can do to reduce the incidence of recurrences. °· Only take over-the-counter or prescription medicines for pain, discomfort, or fever as directed by your health care provider. °· Keep all follow-up visits as told by your health care provider. This is important. °· Get follow-up X-rays if required. The absence of pain does not always mean that the stone has passed. It may have only stopped moving. If the urine remains completely obstructed, it can cause loss of kidney function or even complete destruction of the kidney. It is your responsibility to make sure X-rays and follow-ups are completed. Ultrasounds of the kidney can show blockages and the status of the kidney. Ultrasounds are not associated with any radiation and can be performed easily in a matter of minutes. °· Make changes to your daily diet as told by your health care provider. You may be told to: °¨ Limit the amount of salt that you eat. °¨ Eat 5 or more servings of fruits and vegetables each day. °¨ Limit the amount of meat, poultry, fish, and eggs that you eat. °· Collect a 24-hour urine sample as told by your health care provider. You may need to collect another urine sample every 6-12   months. °SEEK MEDICAL CARE IF: °· You experience pain that is progressive and unresponsive to any pain medicine you have been prescribed. °SEEK IMMEDIATE MEDICAL CARE IF:  °· Pain cannot be controlled with the prescribed medicine. °· You have a fever or shaking chills. °· The severity or intensity of pain increases over 18 hours and is not  relieved by pain medicine. °· You develop a new onset of abdominal pain. °· You feel faint or pass out. °· You are unable to urinate. °  °This information is not intended to replace advice given to you by your health care provider. Make sure you discuss any questions you have with your health care provider. °  °Document Released: 08/05/2005 Document Revised: 04/26/2015 Document Reviewed: 01/06/2013 °[employer] Interactive Patient Education ©2016 [employer] ° °

## 2015-07-20 ENCOUNTER — Ambulatory Visit (INDEPENDENT_AMBULATORY_CARE_PROVIDER_SITE_OTHER): Payer: BLUE CROSS/BLUE SHIELD | Admitting: Urology

## 2015-07-20 ENCOUNTER — Encounter: Payer: Self-pay | Admitting: Urology

## 2015-07-20 VITALS — BP 122/81 | HR 78 | Ht 69.0 in | Wt 200.0 lb

## 2015-07-20 DIAGNOSIS — R10A1 Flank pain, right side: Secondary | ICD-10-CM | POA: Insufficient documentation

## 2015-07-20 DIAGNOSIS — R109 Unspecified abdominal pain: Secondary | ICD-10-CM | POA: Diagnosis not present

## 2015-07-20 DIAGNOSIS — Z87442 Personal history of urinary calculi: Secondary | ICD-10-CM | POA: Insufficient documentation

## 2015-07-20 DIAGNOSIS — R3129 Other microscopic hematuria: Secondary | ICD-10-CM | POA: Insufficient documentation

## 2015-07-20 LAB — URINALYSIS, COMPLETE
BILIRUBIN UA: NEGATIVE
KETONES UA: NEGATIVE
Leukocytes, UA: NEGATIVE
NITRITE UA: NEGATIVE
PROTEIN UA: NEGATIVE
SPEC GRAV UA: 1.015 (ref 1.005–1.030)
UUROB: 1 mg/dL (ref 0.2–1.0)
pH, UA: 8.5 — ABNORMAL HIGH (ref 5.0–7.5)

## 2015-07-20 LAB — MICROSCOPIC EXAMINATION
Bacteria, UA: NONE SEEN
Epithelial Cells (non renal): NONE SEEN /hpf (ref 0–10)

## 2015-07-20 NOTE — Progress Notes (Signed)
07/20/2015 3:49 PM   Donald Tran 12-Jan-1971 [id number]  Referring provider: Linna Hoff, MD [address] [place], [place] [address]  Chief Complaint  Patient presents with  . Nephrolithiasis    ER referral    HPI: Patient is a 44 year old white male with a history of nephrolithiasis who 5 days ago had the sudden onset of right-sided flank pain. It did not radiate. He states it was reminiscent for previous stones he had in the past.  He increased his water consumption and took some Vicodin his wife had on hand.  He then used a heating pad, but he could not find relief. He then went to the emergency room and was given Zofran, Toradol and Dilaudid.  His pain then abated.  He did not have any imaging studies.  His UA was positive for microscopic hematuria while in the emergency room Department, but he denies any gross hematuria.  He was then given Toradol, Flomax and Percocet and discharged home.  Since that time, he has had pain daily requiring taking up to 2 Percocets.  Today, he has not had any pain.  He is not having any difficulty with urination. He is not having fevers, chills, nausea or vomiting.  He is a previous patient of Dr. Heywood Footman.   He states that he is had a stone analysis and he reduce his calcium oxalate stones. He has also undergone a 24 hour urine workup and was told he did not drink enough water.   PMH: Past Medical History  Diagnosis Date  . OSA (obstructive sleep apnea)   . Chronic back pain   . Hyperlipidemia   . Kidney stones   . History of stomach ulcers     Surgical History: Past Surgical History  Procedure Laterality Date  . Vasectomy      Home Medications:    Medication List       This list is accurate as of: 07/20/15  3:49 PM.  Always use your most recent med list.               cetirizine 10 MG tablet  Commonly known as:  ZYRTEC  Take 10 mg by mouth daily.     ipratropium 0.06 % nasal spray  Commonly known as:  ATROVENT    USE 1 TO 2 SPRAYS EACH NOSTRIL 3 TIMES A DAY     ketorolac 10 MG tablet  Commonly known as:  TORADOL  Take 1 tablet (10 mg total) by mouth every 6 (six) hours as needed.     ondansetron 4 MG disintegrating tablet  Commonly known as:  ZOFRAN-ODT  Take 1 tablet (4 mg total) by mouth every 8 (eight) hours as needed for nausea or vomiting.     oxyCODONE-acetaminophen 5-325 MG tablet  Commonly known as:  ROXICET  Take 1-2 tablets by mouth every 6 (six) hours as needed for severe pain.     pramipexole 0.125 MG tablet  Commonly known as:  MIRAPEX  Take 0.125 mg by mouth at bedtime.     simvastatin 40 MG tablet  Commonly known as:  ZOCOR  Take 40 mg by mouth daily.     tamsulosin 0.4 MG Caps capsule  Commonly known as:  FLOMAX  Take 2 capsules (0.8 mg total) by mouth daily.        Allergies: No Known Allergies  Family History: Family History  Problem Relation Age of Onset  . Kidney disease Neg Hx   . Prostate cancer Neg  Hx     Social History:  reports that he has never smoked. He does not have any smokeless tobacco history on file. He reports that he does not drink alcohol or use illicit drugs.  ROS: UROLOGY Frequent Urination?: Yes Hard to postpone urination?: No Burning/pain with urination?: No Get up at night to urinate?: Yes Leakage of urine?: No Urine stream starts and stops?: No Trouble starting stream?: No Do you have to strain to urinate?: No Blood in urine?: Yes Urinary tract infection?: No Sexually transmitted disease?: No Injury to kidneys or bladder?: No Painful intercourse?: No Weak stream?: No Erection problems?: No Penile pain?: No  Gastrointestinal Nausea?: Yes Vomiting?: Yes Indigestion/heartburn?: No Diarrhea?: Yes Constipation?: Yes  Constitutional Fever: No Night sweats?: No Weight loss?: No Fatigue?: Yes  Skin Skin rash/lesions?: No Itching?: No  Eyes Blurred vision?: No Double vision?: No  Ears/Nose/Throat Sore throat?:  No Sinus problems?: No  Hematologic/Lymphatic Swollen glands?: No Easy bruising?: No  Cardiovascular Leg swelling?: No Chest pain?: No  Respiratory Cough?: No Shortness of breath?: No  Endocrine Excessive thirst?: No  Musculoskeletal Back pain?: No Joint pain?: No  Neurological Headaches?: No Dizziness?: No  Psychologic Depression?: No Anxiety?: No  Physical Exam: BP 122/81 mmHg  Pulse 78  Ht 5\' 9"  (1.753 m)  Wt 200 lb (90.719 kg)  BMI 29.52 kg/m2  Constitutional: Well nourished. Alert and oriented, No acute distress. HEENT:  AT, moist mucus membranes. Trachea midline, no masses. Cardiovascular: No clubbing, cyanosis, or edema. Respiratory: Normal respiratory effort, no increased work of breathing. GI: Abdomen is soft, non tender, non distended, no abdominal masses. Liver and spleen not palpable.  No hernias appreciated.  Stool sample for occult testing is not indicated.   GU: No CVA tenderness.  No bladder fullness or masses.  Patient with circumcised phallus.   Urethral meatus is patent.  No penile discharge. No penile lesions or rashes. Scrotum without lesions, cysts, rashes and/or edema.  Testicles are located scrotally bilaterally. No masses are appreciated in the testicles. Left and right epididymis are normal. Rectal: Patient with  normal sphincter tone. Anus and perineum without scarring or rashes. No rectal masses are appreciated. Prostate is approximately 50 grams, no nodules are appreciated. Seminal vesicles are normal. Skin: No rashes, bruises or suspicious lesions. Lymph: No cervical or inguinal adenopathy. Neurologic: Grossly intact, no focal deficits, moving all 4 extremities. Psychiatric: Normal mood and affect.  Laboratory Data: Lab Results  Component Value Date   WBC 8.8 07/15/2015   HGB 16.1 07/15/2015   HCT 46.7 07/15/2015   MCV 90.9 07/15/2015   PLT 209 07/15/2015    Lab Results  Component Value Date   CREATININE 1.27* 07/15/2015     Urinalysis Results for orders placed or performed in visit on 07/20/15  CULTURE, URINE COMPREHENSIVE  Result Value Ref Range   Urine Culture, Comprehensive Final report    Result 1 Comment   Microscopic Examination  Result Value Ref Range   WBC, UA 0-5 0 -  5 /hpf   RBC, UA 0-2 0 -  2 /hpf   Epithelial Cells (non renal) None seen 0 - 10 /hpf   Crystals Present (A) N/A   Crystal Type Amorphous Sediment N/A   Bacteria, UA None seen None seen/Few  Urinalysis, Complete  Result Value Ref Range   Specific Gravity, UA 1.015 1.005 - 1.030   pH, UA 8.5 (H) 5.0 - 7.5   Color, UA Yellow Yellow   Appearance Ur Clear Clear  Leukocytes, UA Negative Negative   Protein, UA Negative Negative/Trace   Glucose, UA 2+ (A) Negative   Ketones, UA Negative Negative   RBC, UA Trace (A) Negative   Bilirubin, UA Negative Negative   Urobilinogen, Ur 1.0 0.2 - 1.0 mg/dL   Nitrite, UA Negative Negative   Microscopic Examination See below:    Assessment & Plan:    1. Right flank pain:   Patient has a history of nephrolithiasis and has had an episode of microscopic hematuria.   I will schedule the patient for a CT urogram.   He will return to discuss the results.   If he should experience fevers or chills, intractable nausea or pain he is to contact our office immediately or seek treatment in the emergency room.   2. Microscopic hematuria:   Patient had TNTC RBC's/hpf while in the ED.   I'll schedule patient for a CT urogram.  It is explained to the patient that they will be scheduled for a CT Urogram with contrast material and that in rare instances, an allergic reaction can be serious and even life threatening with the injection of contrast material.   The patient denies any allergies to contrast, iodine and/or seafood and is not taking metformin.  - Urinalysis, Complete - CULTURE, URINE COMPREHENSIVE  3. History of kidney stones:   Patient has had a stone analysis and a 24 hour urine workup in the  past.  He was told he did not drink on all 4 and he admits he is still lacking on his water intake.   Return for CT Urogram report.  Michiel Cowboy, PA-C  [facility] [address], [address] [place], [place] [address] [phone]

## 2015-07-22 LAB — CULTURE, URINE COMPREHENSIVE

## 2015-07-27 ENCOUNTER — Telehealth: Payer: Self-pay

## 2015-07-27 ENCOUNTER — Encounter: Payer: Self-pay | Admitting: Urology

## 2015-07-27 NOTE — Telephone Encounter (Signed)
We did discuss this issue during his office visit.  I do want him to still have a CT scan.  Most kidney cancers are found incidentally and I would also like to know what his current stone burden is at this time.  According to the [facility], the lifetime risk for kidney cancer is 1 in [id number] persons.  It is more common in men.  The ultimate decision is up to Donald Tran.

## 2015-07-27 NOTE — Telephone Encounter (Signed)
Hi [name], I passed my kidney stone last night! I know you said to get the CT scan even if I passed the stone because of the blood in my urine. I had a full physical in early October and my urine analysis shows occult blood as negative. Since I had no blood 2 months ago, would I be OK to skip the CT scan or do you want me to go ahead with it? I will go with whatever you suggest. Also, I have the stone if you want me to bring it to the office.        Thanks,    Donald SpiesChris Strother      This is a Wellsite geologistmychart message

## 2015-07-27 NOTE — Telephone Encounter (Signed)
We did discuss this in the during his exam on the

## 2015-07-28 ENCOUNTER — Ambulatory Visit
Admission: RE | Admit: 2015-07-28 | Discharge: 2015-07-28 | Disposition: A | Discharge status: Home / Self Care | Payer: BLUE CROSS/BLUE SHIELD | Source: Ambulatory Visit | Attending: Urology | Admitting: Urology

## 2015-07-28 DIAGNOSIS — R3129 Other microscopic hematuria: Secondary | ICD-10-CM | POA: Insufficient documentation

## 2015-07-28 MED ORDER — IOHEXOL 300 MG/ML  SOLN
100.0000 mL | Freq: Once | INTRAMUSCULAR | Status: AC | PRN
  Administered 2015-07-28: 150 mL via INTRAVENOUS

## 2015-07-31 ENCOUNTER — Ambulatory Visit (INDEPENDENT_AMBULATORY_CARE_PROVIDER_SITE_OTHER): Payer: BLUE CROSS/BLUE SHIELD | Admitting: Urology

## 2015-07-31 ENCOUNTER — Encounter: Payer: Self-pay | Admitting: Urology

## 2015-07-31 VITALS — BP 132/75 | HR 79 | Ht 69.0 in | Wt 201.8 lb

## 2015-07-31 DIAGNOSIS — R109 Unspecified abdominal pain: Secondary | ICD-10-CM | POA: Diagnosis not present

## 2015-07-31 DIAGNOSIS — Z87442 Personal history of urinary calculi: Secondary | ICD-10-CM | POA: Diagnosis not present

## 2015-07-31 DIAGNOSIS — R3129 Other microscopic hematuria: Secondary | ICD-10-CM | POA: Diagnosis not present

## 2015-07-31 LAB — URINALYSIS, COMPLETE
Bilirubin, UA: NEGATIVE
Glucose, UA: NEGATIVE
Ketones, UA: NEGATIVE
LEUKOCYTES UA: NEGATIVE
NITRITE UA: NEGATIVE
Protein, UA: NEGATIVE
Specific Gravity, UA: 1.02 (ref 1.005–1.030)
Urobilinogen, Ur: 0.2 mg/dL (ref 0.2–1.0)
pH, UA: 7 (ref 5.0–7.5)

## 2015-07-31 LAB — MICROSCOPIC EXAMINATION
BACTERIA UA: NONE SEEN
EPITHELIAL CELLS (NON RENAL): NONE SEEN /HPF (ref 0–10)

## 2015-07-31 NOTE — Progress Notes (Signed)
9:52 AM   Donald Tran [birth year] [id number]  Referring provider: Linna Hoff, MD [address] [place], [place] [address]  Chief Complaint  Patient presents with  . Results    CT urogram    HPI: Patient is a 44 year old white male with a history of nephrolithiasis, right flank pain and microscopic hematuria who presents today for his CT Urogram report.    Background story Patient is a 44 year old white male with a history of nephrolithiasis who had the sudden onset of right-sided flank pain. It did not radiate. He states it was reminiscent for previous stones he had in the past.  He increased his water consumption and took some Vicodin his wife had on hand.  He then used a heating pad, but he could not find relief. He then went to the emergency room and was given Zofran, Toradol and Dilaudid.  His pain then abated.  He did not have any imaging studies.  His UA was positive for microscopic hematuria while in the emergency room Department, but he denies any gross hematuria.  He was then given Toradol, Flomax and Percocet and discharged home.   He is a previous patient of Dr. Heywood Footman.   He states that he is had a stone analysis and he reduce his calcium oxalate stones. He has also undergone a 24 hour urine workup and was told he did not drink enough water.  Today, he brings in the stone he had passed on 08/14/15.  He has not experienced any gross hematuria.  He is no longer having flank pain.  He also denies any fevers, chills, nausea and vomiting.  CT Urogram performed on 07/28/2015 did not demonstrate any kidney stones.  His lower ureters were not completely opacified.  I have reviewed the films with the patient.    He is a previous patient of Dr. Heywood Footman. He states that he is had a stone analysis and he reduce his calcium oxalate stones. He has also undergone a 24 hour urine workup and was told he did not drink enough water.   PMH: Past Medical History  Diagnosis Date   . OSA (obstructive sleep apnea)   . Chronic back pain   . Hyperlipidemia   . Kidney stones   . History of stomach ulcers     Surgical History: Past Surgical History  Procedure Laterality Date  . Vasectomy      Home Medications:    Medication List       This list is accurate as of: 07/31/15  9:52 AM.  Always use your most recent med list.               cetirizine 10 MG tablet  Commonly known as:  ZYRTEC  Take 10 mg by mouth daily.     ipratropium 0.06 % nasal spray  Commonly known as:  ATROVENT  USE 1 TO 2 SPRAYS EACH NOSTRIL 3 TIMES A DAY     ketorolac 10 MG tablet  Commonly known as:  TORADOL  Take 1 tablet (10 mg total) by mouth every 6 (six) hours as needed.     ondansetron 4 MG disintegrating tablet  Commonly known as:  ZOFRAN-ODT  Take 1 tablet (4 mg total) by mouth every 8 (eight) hours as needed for nausea or vomiting.     oxyCODONE-acetaminophen 5-325 MG tablet  Commonly known as:  ROXICET  Take 1-2 tablets by mouth every 6 (six) hours as needed for severe pain.  pramipexole 0.125 MG tablet  Commonly known as:  MIRAPEX  Take 0.125 mg by mouth at bedtime.     simvastatin 40 MG tablet  Commonly known as:  ZOCOR  Take 40 mg by mouth daily.     tamsulosin 0.4 MG Caps capsule  Commonly known as:  FLOMAX  Take 2 capsules (0.8 mg total) by mouth daily.        Allergies: No Known Allergies  Family History: Family History  Problem Relation Age of Onset  . Kidney disease Neg Hx   . Prostate cancer Neg Hx     Social History:  reports that he has never smoked. He does not have any smokeless tobacco history on file. He reports that he drinks alcohol. He reports that he does not use illicit drugs.  ROS: UROLOGY Frequent Urination?: No Hard to postpone urination?: No Burning/pain with urination?: No Get up at night to urinate?: No Leakage of urine?: No Urine stream starts and stops?: No Trouble starting stream?: No Do you have to strain to  urinate?: No Blood in urine?: No Urinary tract infection?: No Sexually transmitted disease?: No Injury to kidneys or bladder?: No Painful intercourse?: No Weak stream?: No Erection problems?: No Penile pain?: No  Gastrointestinal Nausea?: No Vomiting?: No Indigestion/heartburn?: No Diarrhea?: No Constipation?: No  Constitutional Fever: No Night sweats?: No Weight loss?: No Fatigue?: No  Skin Skin rash/lesions?: No Itching?: No  Eyes Blurred vision?: No Double vision?: No  Ears/Nose/Throat Sore throat?: No Sinus problems?: No  Hematologic/Lymphatic Swollen glands?: No Easy bruising?: No  Cardiovascular Leg swelling?: No Chest pain?: No  Respiratory Cough?: No Shortness of breath?: No  Endocrine Excessive thirst?: No  Musculoskeletal Back pain?: No Joint pain?: No  Neurological Headaches?: No Dizziness?: No  Psychologic Depression?: No Anxiety?: No  Physical Exam: BP 132/75 mmHg  Pulse 79  Ht  (1.753 m)  Wt 201 lb 12.8 oz (91.536 kg)  BMI 29.79 kg/m2  Constitutional: Well nourished. Alert and oriented, No acute distress. HEENT:  AT, moist mucus membranes. Trachea midline, no masses. Cardiovascular: No clubbing, cyanosis, or edema. Respiratory: Normal respiratory effort, no increased work of breathing. Skin: No rashes, bruises or suspicious lesions. Lymph: No cervical or inguinal adenopathy. Neurologic: Grossly intact, no focal deficits, moving all 4 extremities. Psychiatric: Normal mood and affect.  Laboratory Data: Lab Results  Component Value Date   WBC 8.8 07/15/2015   HGB 16.1 07/15/2015   HCT 46.7 07/15/2015   MCV 90.9 07/15/2015   PLT 209 07/15/2015    Lab Results  Component Value Date   CREATININE 1.27* 07/15/2015    Urinalysis Results for orders placed or performed in visit on 07/20/15  CULTURE, URINE COMPREHENSIVE  Result Value Ref Range   Urine Culture, Comprehensive Final report    Result 1 Comment     Microscopic Examination  Result Value Ref Range   WBC, UA 0-5 0 -  5 /hpf   RBC, UA 0-2 0 -  2 /hpf   Epithelial Cells (non renal) None seen 0 - 10 /hpf   Crystals Present (A) N/A   Crystal Type Amorphous Sediment N/A   Bacteria, UA None seen None seen/Few  Urinalysis, Complete  Result Value Ref Range   Specific Gravity, UA 1.015 1.005 - 1.030   pH, UA 8.5 (H) 5.0 - 7.5   Color, UA Yellow Yellow   Appearance Ur Clear Clear   Leukocytes, UA Negative Negative   Protein, UA Negative Negative/Trace   Glucose, UA  2+ (A) Negative   Ketones, UA Negative Negative   RBC, UA Trace (A) Negative   Bilirubin, UA Negative Negative   Urobilinogen, Ur 1.0 0.2 - 1.0 mg/dL   Nitrite, UA Negative Negative   Microscopic Examination See below:    Pertinent Imaging CLINICAL DATA: Kidney stones.  EXAM: CT ABDOMEN AND PELVIS WITHOUT AND WITH CONTRAST  TECHNIQUE: Multidetector CT imaging of the abdomen and pelvis was performed following the standard protocol before and following the bolus administration of intravenous contrast.  CONTRAST: 150mL OMNIPAQUE IOHEXOL 300 MG/ML SOLN  COMPARISON: None  FINDINGS: Lower chest: No pleural or pericardial effusion. Lung bases appear clear.  Hepatobiliary: The adrenal glands are normal. Normal appearance of the liver. Gallbladder is normal. No biliary dilatation.  Pancreas: The pancreas is unremarkable.  Spleen: The spleen is unremarkable.  Adrenals/Urinary Tract: Normal appearance of the adrenal glands. Normal appearance of the kidneys. No kidney stones identified. On the delayed images there is symmetric excretion of contrast material from both kidneys. No suspicious filling defects identified.  Stomach/Bowel: The stomach is normal. The small bowel loops have a normal course and caliber. No obstruction. The appendix is visualized and appears normal. Unremarkable appearance of the colon.  Vascular/Lymphatic: Normal appearance  of the abdominal aorta. No enlarged retroperitoneal or mesenteric adenopathy. No enlarged pelvic or inguinal lymph nodes.  Reproductive: The prostate gland and seminal vesicles are unremarkable.  Other: No free fluid or fluid collections. Periumbilical hernia contains fat only.  Musculoskeletal: Mild degenerative disc disease within the lower lumbar spine.  IMPRESSION: 1. No kidney stones identified. No findings to explain patient's hematuria.   Electronically Signed  By: Signa Kellaylor Stroud M.D.  On: 07/28/2015 09:52  Assessment & Plan:    1. Right flank pain:   Patient has passed a stone on 07/26/2015.  He has not had any further flank pain.  CT Urogram did not demonstrate any hydronephrosis or other calculi.    2. Microscopic hematuria:   Patient had TNTC RBC's/hpf while in the ED.  Since patient's ureters were not completely opacified, I do offer the patient cystoscopy with bilateral retrogrades in the OR.  He is hesitant to undergo an invasive work up at this time.  He is a non-smoker.  He did not have any episodes of gross hematuria.  I think it is reasonable not to pursue the retrogrades at this time.  I also offered a cystoscopy in the office, but the patient did not want to pursue this study at this time.  I did explain that the CT Urogram would not be able to identify any small tumors in the bladder.  He understood this and did not want to schedule the study.  His UA today was negative for infection.   - Urinalysis, Complete   3. History of kidney stones:   Patient has had a stone analysis and a 24 hour urine workup in the past.  He does not want to undergo further studies at this time. We did discuss increasing his water intake, avoiding sodas and increasing his citrate levels with citrus juices.     Return if symptoms worsen or fail to improve.  Michiel CowboySHANNON Mykenna Viele, PA-C  [facility] [address], [address] [place], [place] [address] [phone]  [phone]

## 2015-08-02 LAB — CULTURE, URINE COMPREHENSIVE

## 2015-09-27 ENCOUNTER — Ambulatory Visit: Payer: Self-pay | Admitting: Internal Medicine

## 2015-10-05 ENCOUNTER — Ambulatory Visit (INDEPENDENT_AMBULATORY_CARE_PROVIDER_SITE_OTHER): Payer: BLUE CROSS/BLUE SHIELD

## 2015-10-05 ENCOUNTER — Ambulatory Visit (INDEPENDENT_AMBULATORY_CARE_PROVIDER_SITE_OTHER): Payer: BLUE CROSS/BLUE SHIELD | Admitting: Podiatry

## 2015-10-05 ENCOUNTER — Encounter: Payer: Self-pay | Admitting: Podiatry

## 2015-10-05 VITALS — BP 116/80 | HR 88 | Resp 16 | Ht 69.0 in | Wt 195.0 lb

## 2015-10-05 DIAGNOSIS — M722 Plantar fascial fibromatosis: Secondary | ICD-10-CM

## 2015-10-05 DIAGNOSIS — M2021 Hallux rigidus, right foot: Secondary | ICD-10-CM

## 2015-10-05 MED ORDER — TRIAMCINOLONE ACETONIDE 10 MG/ML IJ SUSP
10.0000 mg | Freq: Once | INTRAMUSCULAR | Status: AC
  Administered 2015-10-05: 10 mg

## 2015-10-05 NOTE — Patient Instructions (Signed)

## 2015-10-05 NOTE — Progress Notes (Signed)
Subjective:     Patient ID: Donald Tran, male   DOB: June 28, 1971, 45 y.o.   MRN: [id number]  HPI patient presents with pain in the plantar aspect left heel of a number of months duration and also problems with the big toe joint right over a year with discomfort that occurs and very indications and at certain times shoes are difficult to wear   Review of Systems  All other systems reviewed and are negative.      Objective:   Physical Exam  Constitutional: He is oriented to person, place, and time.  Cardiovascular: Intact distal pulses.   Musculoskeletal: Normal range of motion.  Neurological: He is oriented to person, place, and time.  Skin: Skin is warm.  Nursing note and vitals reviewed.  neurovascular status found to be intact muscle strength adequate range of motion subtalar midtarsal joint within normal limits. Patient's found have significant limitation loss right first MPJ with large dorsal spur formation but no crepitus within the joint and is noted to have discomfort in the medial and central portion of the plantar fascia with some complaints of lateral pain which may be compensation in appearance. Patient has good digital perfusion and is well oriented 3     Assessment:     Hallux limitus deformity right with large spur formation and acute plantar fasciitis left    Plan:     H&P and both conditions reviewed with patient. Due to spurring elevation of the first metatarsal I do think that the right we'll need to be fixed at one point and we will decide next week whether it's best to do right away or to wait and today I injected the left plantar fascia 3 Milligan Kenalog 5 mg Xylocaine and applied fascial brace and gave instructions on shoe gear modifications physical therapy. We will also discussed long-term orthotics for this patient  X-ray report indicated large spur around the first metatarsal head right with elevation of the first metatarsal segment and also mild small  plantar spur left heel

## 2015-10-05 NOTE — Progress Notes (Signed)
   Subjective:    Patient ID: Donald GAUMERook, male    DOB: [birth year], 45 y.o.   MRN: [id number]  HPI Patient presents with bilateral foot pain. Left foot-heel; x6 months; Right foot-Joint in great toe; x1 yr.   Review of Systems  All other systems reviewed and are negative.      Objective:   Physical Exam        Assessment & Plan:

## 2015-10-12 ENCOUNTER — Ambulatory Visit (INDEPENDENT_AMBULATORY_CARE_PROVIDER_SITE_OTHER): Payer: BLUE CROSS/BLUE SHIELD | Admitting: Podiatry

## 2015-10-12 ENCOUNTER — Encounter: Payer: Self-pay | Admitting: Podiatry

## 2015-10-12 DIAGNOSIS — M2021 Hallux rigidus, right foot: Secondary | ICD-10-CM

## 2015-10-12 DIAGNOSIS — M722 Plantar fascial fibromatosis: Secondary | ICD-10-CM | POA: Diagnosis not present

## 2015-10-12 NOTE — Progress Notes (Signed)
Subjective:     Patient ID: Donald Tran, male   DOB: [birth year], 45 y.o.   MRN: [id number]  HPI patient states I'm doing quite a bit better with discomfort still of a mild nature left plantar fascia at the big toe joint right doing well   Review of Systems     Objective:   Physical Exam Neurovascular status intact negative Homans sign noted with patient having mild discomfort plantar fascial left and also noted on the right big toe joint to have some restriction in motion with spur formation    Assessment:     plantar fasciitis left still present but improved and hallux limitus right    Plan:     Reviewed both conditions and at this time do not recommend further treatment except for physical therapy supportive shoes and ice therapy. Reappoint as indicated and some day will most likely require surgery on the right big toe joint

## 2016-03-21 ENCOUNTER — Ambulatory Visit (INDEPENDENT_AMBULATORY_CARE_PROVIDER_SITE_OTHER): Payer: BLUE CROSS/BLUE SHIELD | Admitting: Podiatry

## 2016-03-21 VITALS — BP 126/87 | HR 74

## 2016-03-21 DIAGNOSIS — M2021 Hallux rigidus, right foot: Secondary | ICD-10-CM | POA: Diagnosis not present

## 2016-03-21 DIAGNOSIS — M722 Plantar fascial fibromatosis: Secondary | ICD-10-CM

## 2016-03-21 MED ORDER — DICLOFENAC SODIUM 75 MG PO TBEC: 75.0000 mg | DELAYED_RELEASE_TABLET | Freq: Two times a day (BID) | ORAL | 2 refills | Status: DC

## 2016-03-21 MED ORDER — TRIAMCINOLONE ACETONIDE 10 MG/ML IJ SUSP
10.0000 mg | Freq: Once | INTRAMUSCULAR | Status: AC
  Administered 2016-03-21: 10 mg

## 2016-03-24 NOTE — Progress Notes (Signed)
Subjective:     Patient ID: Donald Tran, male   DOB: 01/18/1971, 45 y.o.   MRN: [id number]  HPI patient presents stating that his heels a been bothering him quite a bit on both and he did not get the orthotics that he was supposed   Review of Systems     Objective:   Physical Exam Neurovascular status intact muscle strength adequate range of motion within normal limits with patient found to have discomfort in the plantar heel region bilateral and discomfort in the first MPJ right with reduced motion    Assessment:     Plantar fasciitis acute bilateral and also noted to have hallux limitus deformity    Plan:     H&P both conditions reviewed with patient and today I injected the plantar fascia bilateral 3 mg Kenalog 5 mg Xylocaine and for the feet I went ahead and scanned and make custom orthotics with instructions on proper usage. Patient will be seen back when those are ready or earlier if needed

## 2016-03-26 ENCOUNTER — Encounter: Payer: Self-pay | Admitting: Podiatry

## 2016-03-28 ENCOUNTER — Telehealth: Payer: Self-pay | Admitting: *Deleted

## 2016-03-28 NOTE — Telephone Encounter (Signed)
I'm ok with that

## 2016-03-28 NOTE — Telephone Encounter (Addendum)
Pt states having stomach pain with the Voltaren, would like a alternative medication, and that the Mobic and Ibuprofen have not helped.03/28/2016-Informed pt of Dr. Beverlee Nimsegal's recommendation for a topical antiinflammatory. Pt states his stomach hasn't bothered him too much lately, and he was wondering if it was due to him not drinking a full 8oz of water with it as instructed.  Pt states he only has a week left of the medication and he feels he can complete it.I told pt he may want to also have the Voltaren with a meal, and if problems do occur call again and we would [facility]Marland Kitchen

## 2016-04-12 ENCOUNTER — Ambulatory Visit: Payer: BLUE CROSS/BLUE SHIELD | Admitting: *Deleted

## 2016-04-12 DIAGNOSIS — M722 Plantar fascial fibromatosis: Secondary | ICD-10-CM

## 2016-04-12 NOTE — Progress Notes (Signed)
Patient ID: Donald Tran, male   DOB: 03/09/1971, 45 y.o.   MRN: [id number]  Patient presents for orthotic pick up.  Verbal and written break in and wear instructions given.  Patient will follow up in 4 weeks if symptoms worsen or fail to improve.

## 2016-04-12 NOTE — Patient Instructions (Signed)

## 2017-01-30 ENCOUNTER — Ambulatory Visit (INDEPENDENT_AMBULATORY_CARE_PROVIDER_SITE_OTHER): Payer: BLUE CROSS/BLUE SHIELD

## 2017-01-30 ENCOUNTER — Ambulatory Visit (INDEPENDENT_AMBULATORY_CARE_PROVIDER_SITE_OTHER): Payer: BLUE CROSS/BLUE SHIELD | Admitting: Podiatry

## 2017-01-30 DIAGNOSIS — M722 Plantar fascial fibromatosis: Secondary | ICD-10-CM

## 2017-01-30 DIAGNOSIS — M2021 Hallux rigidus, right foot: Secondary | ICD-10-CM

## 2017-01-30 MED ORDER — TRIAMCINOLONE ACETONIDE 10 MG/ML IJ SUSP
10.0000 mg | Freq: Once | INTRAMUSCULAR | Status: AC
  Administered 2017-01-30: 10 mg

## 2017-01-31 NOTE — Progress Notes (Signed)
Subjective:    Patient ID: Donald Tran, male   DOB: 46 y.o.   MRN: [id number]   HPI patient states that he's had a increase in pain in his left heel over the last 2 months and admits that he still had chronic pain in it over the last 10 left    ROS      Objective:  Physical Exam neurovascular status intact negative Homan sign was noted with patient's left plantar heel being very tender at the insertional point tendon calcaneus and slightly distal.     Assessment:    Chronic plantar fasciitis left with inflammation fluid around the medial band     Plan:    Reviewed condition and recommended night splint usage at this time and reinjected the plantar fascia 3 mg Kenalog 5 mg Xylocaine and advised on physical therapy. Patient be seen back for us to recheck again in the next month and ultimately may require more aggressive treatment plan if symptoms continue to be to this

## 2017-03-27 ENCOUNTER — Ambulatory Visit (INDEPENDENT_AMBULATORY_CARE_PROVIDER_SITE_OTHER): Payer: BLUE CROSS/BLUE SHIELD | Admitting: Podiatry

## 2017-03-27 ENCOUNTER — Encounter: Payer: Self-pay | Admitting: Podiatry

## 2017-03-27 DIAGNOSIS — M722 Plantar fascial fibromatosis: Secondary | ICD-10-CM | POA: Diagnosis not present

## 2017-03-27 MED ORDER — TRIAMCINOLONE ACETONIDE 10 MG/ML IJ SUSP
10.0000 mg | Freq: Once | INTRAMUSCULAR | Status: AC
  Administered 2017-03-27: 10 mg

## 2017-03-27 MED ORDER — MELOXICAM 15 MG PO TABS: 15.0000 mg | ORAL_TABLET | Freq: Every day | ORAL | 2 refills | Status: DC

## 2017-03-27 NOTE — Progress Notes (Signed)
Subjective:    Patient ID: Donald Tran, male   DOB: 46 y.o.   MRN: [id number]   HPI patient states he still having a lot of pain in his left heel and he was better for several weeks followed by significant reoccurrence    ROS      Objective:  Physical Exam neurovascular status intact with exquisite discomfort left plantar fashion slightly distal to insertion calcaneus     Assessment:    Continued acute fasciitis left heel     Plan:    At this point went ahead and reinjected the plantar fascia 3 Milligan Kenalog 5 mill grams Xylocaine and placed the patient into a air fracture walker for complete immobilization. Reappoint to recheck in 5 weeks and in 3 weeks we'll stop the boot and we will test and see how it responds

## 2017-05-01 ENCOUNTER — Encounter: Payer: Self-pay | Admitting: Podiatry

## 2017-05-01 ENCOUNTER — Ambulatory Visit (INDEPENDENT_AMBULATORY_CARE_PROVIDER_SITE_OTHER): Payer: BLUE CROSS/BLUE SHIELD | Admitting: Podiatry

## 2017-05-01 DIAGNOSIS — M722 Plantar fascial fibromatosis: Secondary | ICD-10-CM

## 2017-05-01 NOTE — Progress Notes (Signed)
Subjective:    Patient ID: Emilee Herohristopher D Brinkley, male   DOB: 46 y.o.   MRN: [id number]   HPI patient presents stating he's having trouble again with his left heel and it's been giving him problems with ambulation    ROS      Objective:  Physical Exam neurovascular status was found to be intact with moderate reoccurrence of fasciitis left which is occurring in a more active role with his orthotics which unfortunately worn out in the plantar surface and he is wearing them constantly     Assessment:    Chronic structural changes secondary to plantar fascial symptoms and arch instability     Plan:    Went ahead today and we scanned him for new pair of orthotics to provide for better plantar support and increase cushioning. Patient will have the original pair  redone as we move forward

## 2017-05-22 ENCOUNTER — Ambulatory Visit: Payer: BLUE CROSS/BLUE SHIELD | Admitting: Orthotics

## 2017-05-22 DIAGNOSIS — M722 Plantar fascial fibromatosis: Secondary | ICD-10-CM

## 2017-05-22 NOTE — Progress Notes (Signed)
Patient came in today to pick up custom made foot orthotics.  The goals were accomplished and the patient reported no dissatisfaction with said orthotics.  Patient was advised of breakin period and how to report any issues. 

## 2017-06-05 ENCOUNTER — Telehealth: Payer: Self-pay | Admitting: Podiatry

## 2017-06-05 NOTE — Telephone Encounter (Signed)
Called pt to let him know refurbished orthotics are in and he can pick them up. Pt states he was told by Raiford Nobleick complimentary. Raiford NobleRick said ok.

## 2017-06-29 IMAGING — CT CT ABD-PEL WO/W CM
2 of 7 series · 13 of 46 positions shown, 18 images · IV contrast (omnipaque)
Comparison: None

CLINICAL DATA: Kidney stones.

EXAM:
CT ABDOMEN AND PELVIS WITHOUT AND WITH CONTRAST
TECHNIQUE: Multidetector CT imaging of the abdomen and pelvis was performed
following the standard protocol before and following the bolus
administration of intravenous contrast.
CONTRAST:  150mL OMNIPAQUE IOHEXOL 300 MG/ML  SOLN

[Series 5: cor hematuria < 45 wo · coronal · 0.71mm/px · 3 of 151 slices shown]
[im 38/151  soft-tissue]
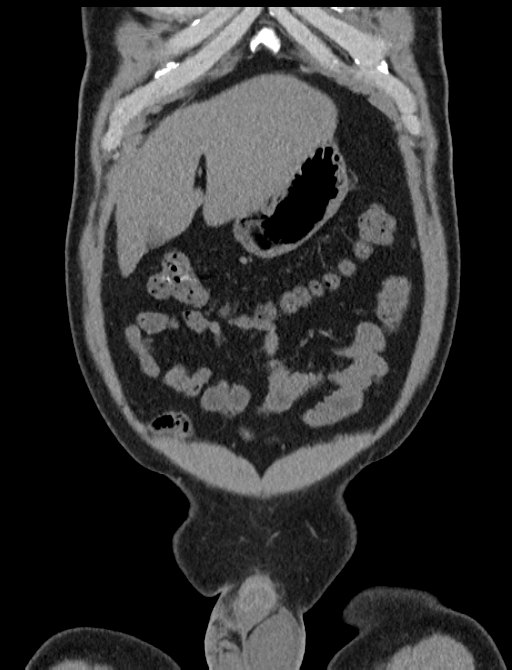
[im 76/151  soft-tissue]
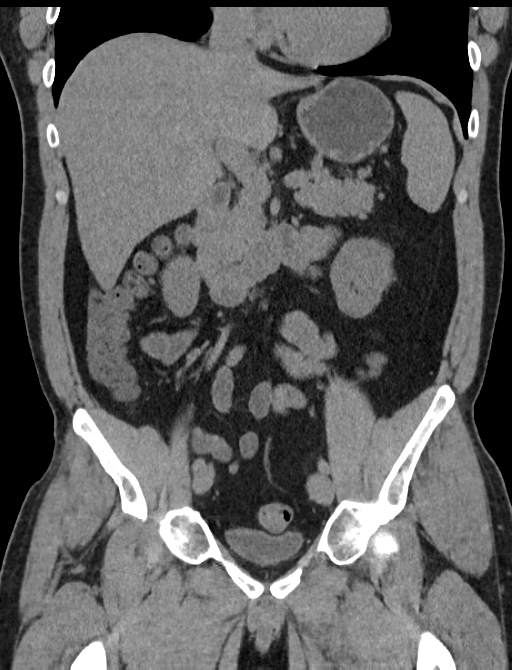
[im 113/151  soft-tissue]
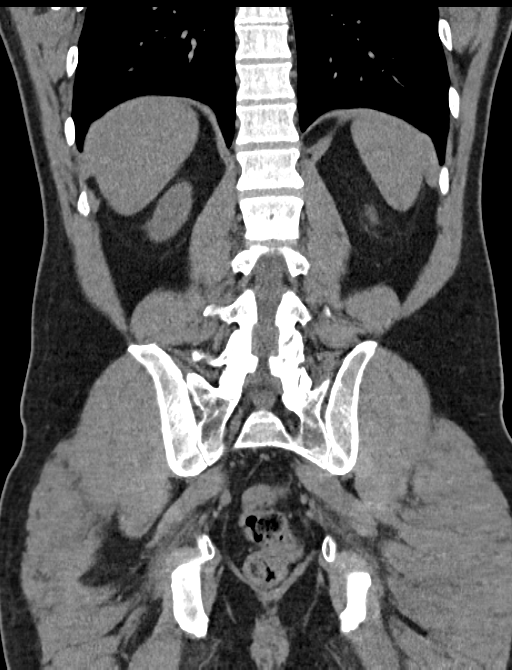

[Series 8: hematuria < 45 with · axial · 0.71mm/px · z∈[-968,-568]mm · 10 of 96 slices shown, 15 images]
[im 8/96  soft-tissue]
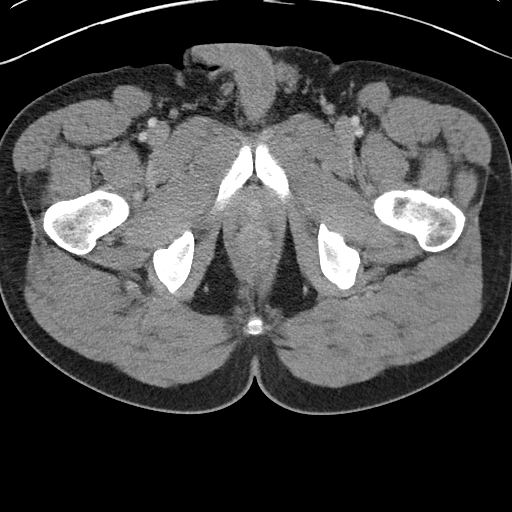
[im 8/96  bone]
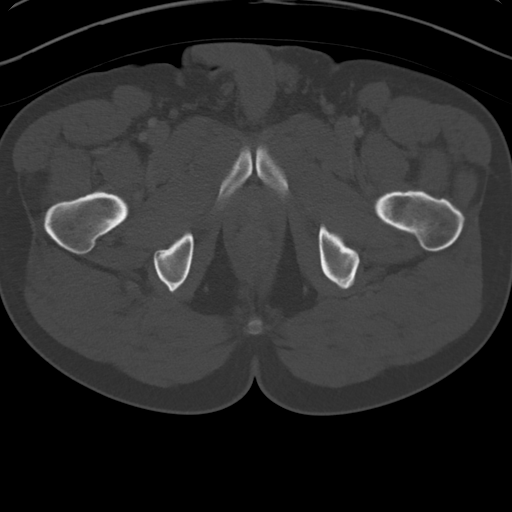
[im 22/96  soft-tissue]
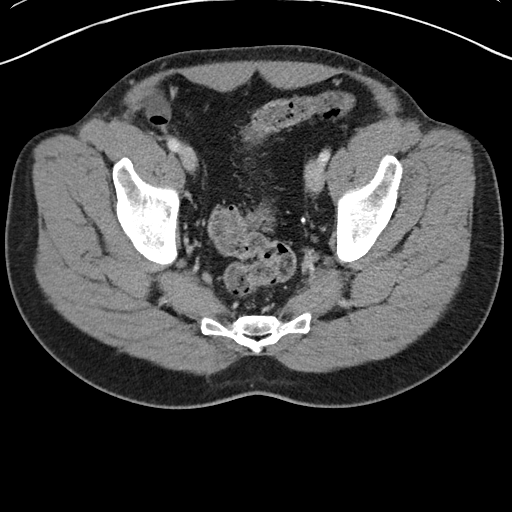
[im 30/96  soft-tissue]
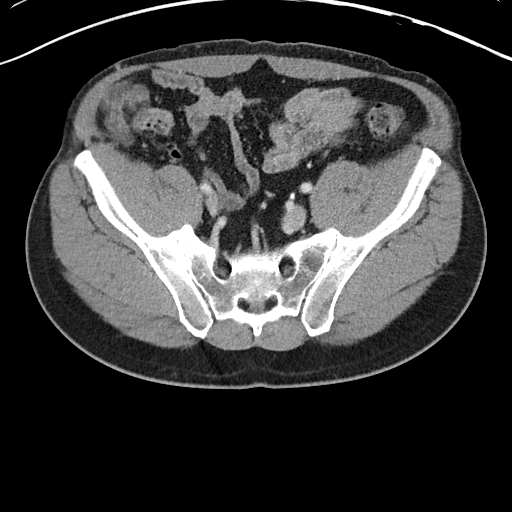
[im 37/96  soft-tissue]
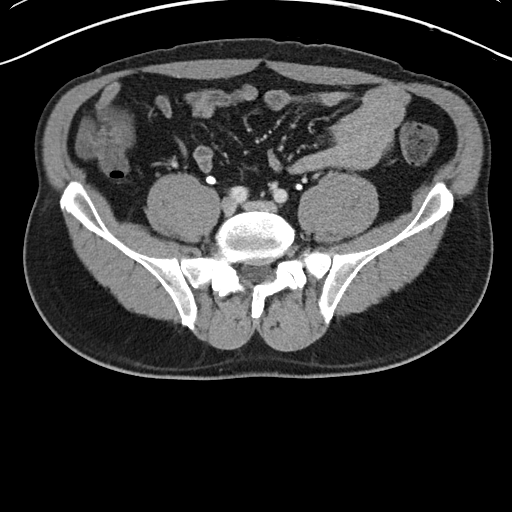
[im 52/96  soft-tissue]
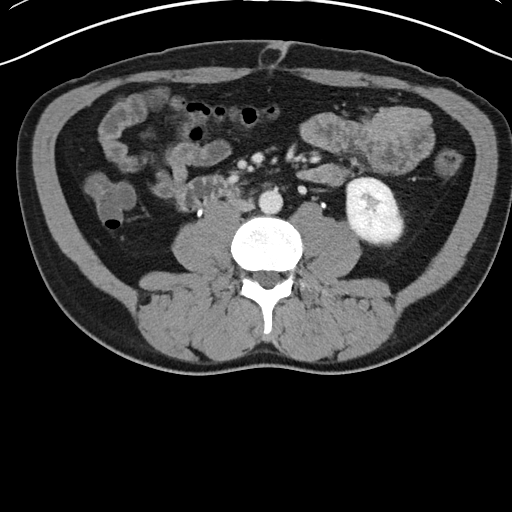
[im 59/96  soft-tissue]
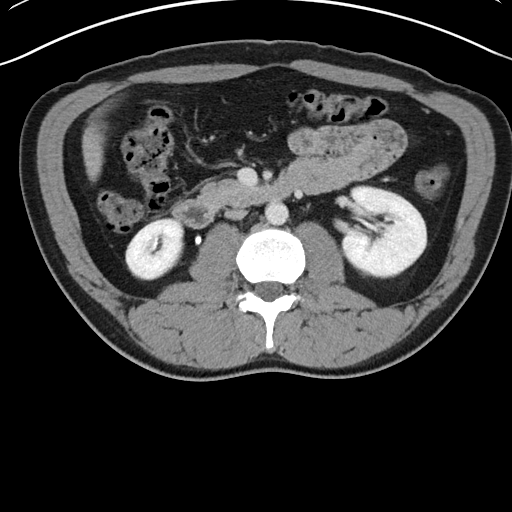
[im 66/96  soft-tissue]
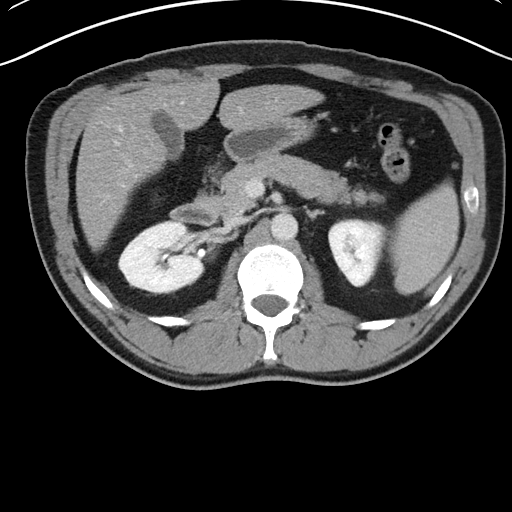
[im 66/96  lung]
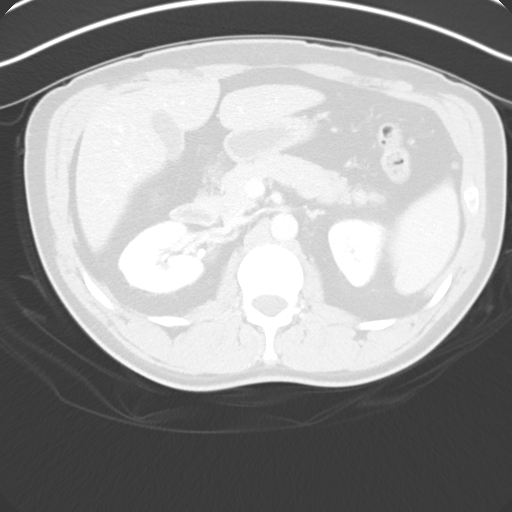
[im 74/96  lung]
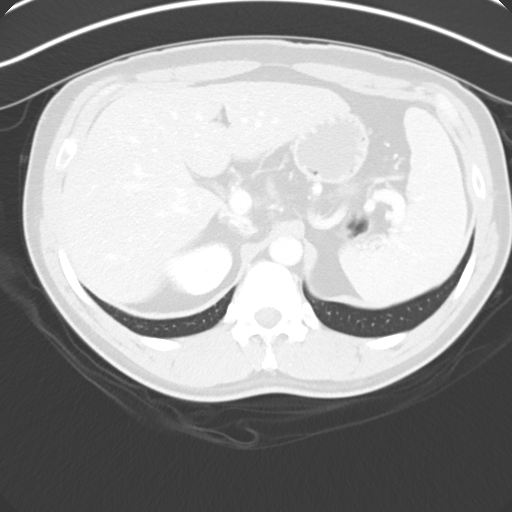
[im 81/96  soft-tissue]
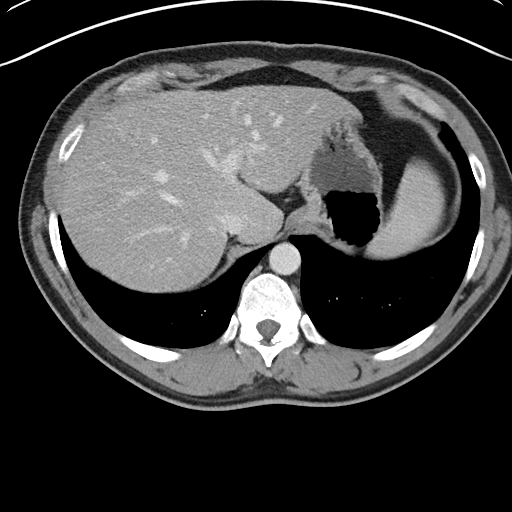
[im 81/96  lung]
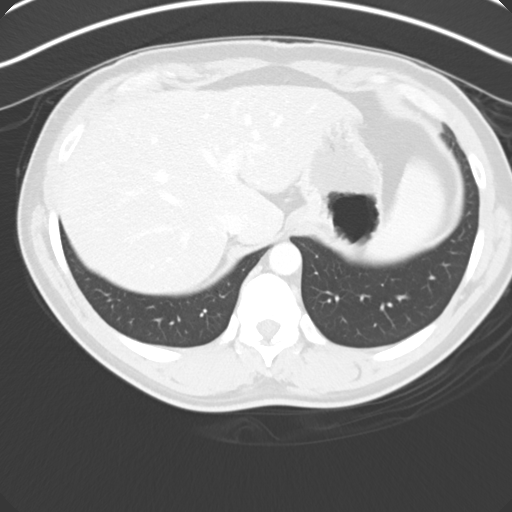
[im 88/96  soft-tissue]
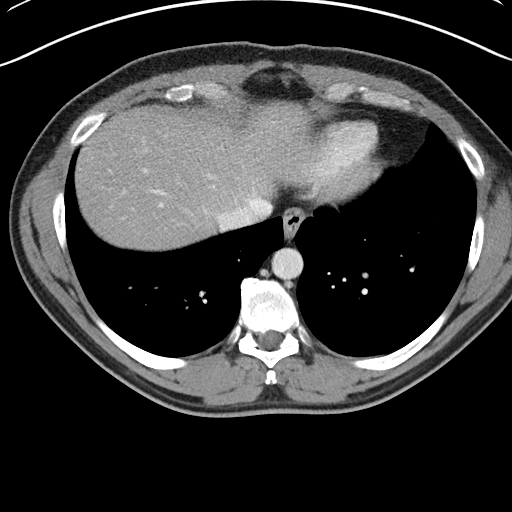
[im 88/96  lung]
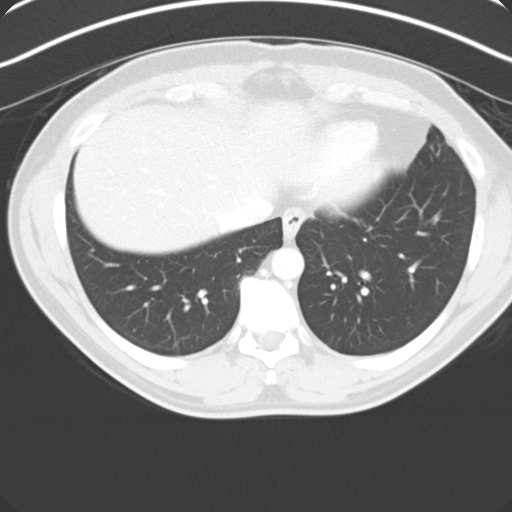
[im 88/96  bone]
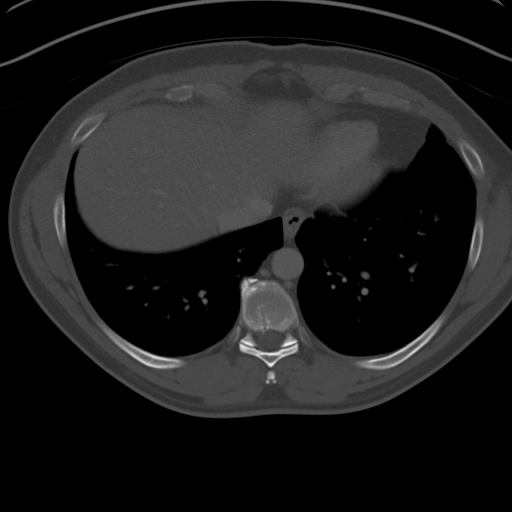

[13 of 46 positions shown; findings below may reference images not displayed]

FINDINGS: Lower chest: No pleural or pericardial effusion. Lung bases appear
clear.

Hepatobiliary: The adrenal glands are normal. Normal appearance of
the liver. Gallbladder is normal. No biliary dilatation.

Pancreas: The pancreas is unremarkable.

Spleen: The spleen is unremarkable.

Adrenals/Urinary Tract: Normal appearance of the adrenal glands.
Normal appearance of the kidneys. No kidney stones identified. On
the delayed images there is symmetric excretion of contrast material
from both kidneys. No suspicious filling defects identified.

Stomach/Bowel: The stomach is normal. The small bowel loops have a
normal course and caliber. No obstruction. The appendix is
visualized and appears normal. Unremarkable appearance of the colon.

Vascular/Lymphatic: Normal appearance of the abdominal aorta. No
enlarged retroperitoneal or mesenteric adenopathy. No enlarged
pelvic or inguinal lymph nodes.

Reproductive: The prostate gland and seminal vesicles are
unremarkable.

Other: No free fluid or fluid collections. Periumbilical hernia
contains fat only.

Musculoskeletal: Mild degenerative disc disease within the lower
lumbar spine.
IMPRESSION: 1. No kidney stones identified. No findings to explain patient's
hematuria.

## 2017-07-02 ENCOUNTER — Encounter: Payer: Self-pay | Admitting: Podiatry

## 2017-07-02 ENCOUNTER — Telehealth: Payer: Self-pay | Admitting: *Deleted

## 2017-07-02 ENCOUNTER — Ambulatory Visit: Payer: BLUE CROSS/BLUE SHIELD | Admitting: Podiatry

## 2017-07-02 DIAGNOSIS — M722 Plantar fascial fibromatosis: Secondary | ICD-10-CM | POA: Diagnosis not present

## 2017-07-02 NOTE — Telephone Encounter (Signed)
"  I was at the office today for Plantar Fasciitis.  I am going to need surgery.  I was wondering if I can schedule that in January.  My [employer] recharges at the beginning of the year.  That just works out better.  Just give me a call back.  We can look at the schedule and see when this can happen."

## 2017-07-02 NOTE — Progress Notes (Signed)
Subjective:    Patient ID: Emilee Herohristopher D Narvaiz, male   DOB: 46 y.o.   MRN: [id number]   HPI patient presents stating that his pain is back in his heel and that he is tired of this and it has been going on for a number of years. He stated when it was immobilized really helped but that it started back and it's back to where it was again    ROS      Objective:  Physical Exam neurovascular status intact with chronic discomfort in the plantar fascial left at the insertional point of the tendon into the calcaneus with inflammation and fluid around the medial band noted     Assessment:   Chronic plantar fasciitis left heel      Plan:    H&P and reviewed condition. At this point I went ahead and I discussed at great length treatment options and the fact we've done numerous injections orthotics immobilization physical therapy and anti-inflammatories. Patient wants to undergo surgery after I reviewed this option versus shockwave and at this point I allowed him to read consent form line byline going over alternative treatments complications. I explained them all complications and the fact the chronic arch pain and other issues can occur and the told recovery can take 6 months to one year for this procedure. Patient wants surgery signed consent form and will call to schedule hopefully by the end of the year depending on his work schedule

## 2017-07-02 NOTE — Patient Instructions (Signed)
Pre-Operative Instructions  Congratulations, you have decided to take an important step towards improving your quality of life.  You can be assured that the doctors and staff at [facility] will be with you every step of the way.  Here are some important things you should know:  1. Plan to be at the surgery center/hospital at least 1 (one) hour prior to your scheduled time, unless otherwise directed by the surgical center/hospital staff.  You must have a responsible adult accompany you, remain during the surgery and drive you home.  Make sure you have directions to the surgical center/hospital to ensure you arrive on time. 2. If you are having surgery at [facility] or [facility] hospitals, you will need a copy of your medical history and physical form from your family physician within one month prior to the date of surgery. We will give you a form for your primary physician to complete.  3. We make every effort to accommodate the date you request for surgery.  However, there are times where surgery dates or times have to be moved.  We will contact you as soon as possible if a change in schedule is required.   4. No aspirin/ibuprofen for one week before surgery.  If you are on aspirin, any non-steroidal anti-inflammatory medications (Mobic, Aleve, Ibuprofen) should not be taken seven (7) days prior to your surgery.  You make take Tylenol for pain prior to surgery.  5. Medications - If you are taking daily heart and blood pressure medications, seizure, reflux, allergy, asthma, anxiety, pain or diabetes medications, make sure you notify the surgery center/hospital before the day of surgery so they can tell you which medications you should take or avoid the day of surgery. 6. No food or drink after midnight the night before surgery unless directed otherwise by surgical center/hospital staff. 7. No alcoholic beverages 24-hours prior to surgery.  No smoking 24-hours prior or 24-hours after  surgery. 8. Wear loose pants or shorts. They should be loose enough to fit over bandages, boots, and casts. 9. Don't wear slip-on shoes. Sneakers are preferred. 10. Bring your boot with you to the surgery center/hospital.  Also bring crutches or a walker if your physician has prescribed it for you.  If you do not have this equipment, it will be provided for you after surgery. 11. If you have not been contacted by the surgery center/hospital by the day before your surgery, call to confirm the date and time of your surgery. 12. Leave-time from work may vary depending on the type of surgery you have.  Appropriate arrangements should be made prior to surgery with your employer. 13. Prescriptions will be provided immediately following surgery by your doctor.  Fill these as soon as possible after surgery and take the medication as directed. Pain medications will not be refilled on weekends and must be approved by the doctor. 14. Remove nail polish on the operative foot and avoid getting pedicures prior to surgery. 15. Wash the night before surgery.  The night before surgery wash the foot and leg well with water and the antibacterial soap provided. Be sure to pay special attention to beneath the toenails and in between the toes.  Wash for at least three (3) minutes. Rinse thoroughly with water and dry well with a towel.  Perform this wash unless told not to do so by your physician.  Enclosed: 1 Ice pack (please put in freezer the night before surgery)   1 Hibiclens skin cleaner     Pre-op instructions  If you have any questions regarding the instructions, please do not hesitate to call our office.  [place]: [address], [place], [place] [address] -- [phone]  [place]: [address] [place], [place] [address] -- [phone]  [place]: [address]  [place], [place] [address] -- [phone]  [place]: [address], [address], [place], [place] [address] -- [phone]  Website:  [email or web address] 

## 2017-07-04 NOTE — Telephone Encounter (Signed)
I attempted to call the patient.  I left him a message to call me back. 

## 2017-07-04 NOTE — Telephone Encounter (Signed)
"  I'm calling you back.  Thanks for trying to return my call.  I'd like to schedule surgery for January of next year.  I want to do it then because my insurance is getting ready to renew."  Do you have a date in mind?  "Can I schedule it for January 15?"  Yes, that date is available, I will get your surgery scheduled.  "What time will it be?"  Someone from the surgical center will call you the Friday or Monday prior to your surgery date.  It will be sometime that morning.

## 2017-08-06 ENCOUNTER — Telehealth: Payer: Self-pay | Admitting: *Deleted

## 2017-08-06 NOTE — Telephone Encounter (Signed)
Request meloxicam. Dr. Charlsie Merlesegal denied until pt is seen again. Return fax denying.

## 2017-08-20 ENCOUNTER — Encounter: Payer: Self-pay | Admitting: Podiatry

## 2017-08-29 ENCOUNTER — Telehealth: Payer: Self-pay | Admitting: *Deleted

## 2017-08-29 NOTE — Telephone Encounter (Signed)
"  I am supposed to have surgery on the 15th.  I would like to talk to a nurse to find out what to expect on Tuesday, like will I be put all the way out or is it a nerve.  You know, just things like that.  Dr. Charlsie Merlesegal going to be doing the surgery on Tuesday, January 15.

## 2017-09-01 ENCOUNTER — Telehealth: Payer: Self-pay | Admitting: Podiatry

## 2017-09-01 NOTE — Telephone Encounter (Signed)
I have surgery scheduled for tomorrow for plantar fascia release. I read through all of the paperwork last night and realized that I was not supposed to take ibuprofen the week before surgery. I took 400 mg of ibuprofen last Thursday and I didn't know if that would be an issue with the surgery tomorrow. If you would give me a call back at [phone] I would appreciate it. Thanks.

## 2017-09-02 ENCOUNTER — Encounter: Payer: Self-pay | Admitting: Podiatry

## 2017-09-02 DIAGNOSIS — M722 Plantar fascial fibromatosis: Secondary | ICD-10-CM | POA: Diagnosis not present

## 2017-09-02 NOTE — Telephone Encounter (Signed)
This is not of concern, patient will be okay for surgery.

## 2017-09-03 ENCOUNTER — Telehealth: Payer: Self-pay | Admitting: Podiatry

## 2017-09-03 ENCOUNTER — Telehealth: Payer: Self-pay

## 2017-09-03 NOTE — Telephone Encounter (Signed)
Spoke with patient, he had concerns about pain on top of the foot, he had surgery 09/02/17. I advised him to loosen wrap and elevate for 15 minutes. Then re-wrap and put boot back on. Denies any acute symptoms or complications at this time. He is to call back with any questions or concerns.

## 2017-09-03 NOTE — Telephone Encounter (Signed)
I had a fascia release done yesterday with Dr. Charlsie Merlesegal. I'm not having any pain on the bottom of my foot however, I'm having some pain at the top of my foot and I think its because of my bandage. It kept me up quite a bit last night and while elevating it did not seem to help, walking around on it some did help. I sent a message via MyChart as well although I have not heard anything so I thought I would call in. I didn't know if I can undo the outer bandage and redo it myself or what you suggested. You can call me back at [phone]. Thank you.

## 2017-09-04 NOTE — Telephone Encounter (Signed)
I apologized for not being in the office to contact him 09/03/2017, and asked if he was still having pain. Pt states he called the office nurse line and someone called and told him to loosen the outer wrap and he had gotten relief. I told pt to call again with concerns.

## 2017-09-10 ENCOUNTER — Encounter: Payer: Self-pay | Admitting: Podiatry

## 2017-09-10 ENCOUNTER — Ambulatory Visit (INDEPENDENT_AMBULATORY_CARE_PROVIDER_SITE_OTHER): Payer: BLUE CROSS/BLUE SHIELD | Admitting: Podiatry

## 2017-09-10 VITALS — BP 148/87 | HR 75 | Temp 98.2°F

## 2017-09-10 DIAGNOSIS — M722 Plantar fascial fibromatosis: Secondary | ICD-10-CM | POA: Diagnosis not present

## 2017-09-10 NOTE — Progress Notes (Signed)
Subjective:   Patient ID: Donald Tran, male   DOB: 47 y.o.   MRN: [id number]   HPI Patient presents stating I am doing really well with minimal discomfort   ROS      Objective:  Physical Exam  Neurovascular status intact with stitches in place on the medial lateral side of the left heel     Assessment:  Patient is doing well post endoscopic surgery left fascia     Plan:  H&P condition reviewed and recommended the continuation of conservative treatment.  Sterile dressing reapplied continue with boot usage gradual surgical shoe usage at home and reappoint 2 weeks for stitch removal or earlier if needed

## 2017-09-13 ENCOUNTER — Encounter: Payer: Self-pay | Admitting: Podiatry

## 2017-09-15 ENCOUNTER — Telehealth: Payer: Self-pay | Admitting: *Deleted

## 2017-09-15 NOTE — Telephone Encounter (Signed)
Pt emailed pictures of the drainage and suture line. I reviewed the pictures and the center of the suture line did appear to be a dried blister, that may feel with water during shower, but the suture line appeared approximated, there is bruising at midfoot and surgery site. I asked pt if the drainage occurred after the shower and pt states it seems to. I asked pt if he could press discharge from the site and he says it just appears to be skin like a dried blister. I told pt to watch the area, if there was redness, increased swelling, red streaks, cloudy drainage, fever and or chills to call immediately, but to call me Wednesday to update on the surgery site. Pt states he may be doing too much his FitBit states he is doing over 9000 steps. I told him that he should only be doing his ADL, nothing much extra.

## 2017-09-16 NOTE — Progress Notes (Signed)
DOS 09/02/2017 EPF Med Band LT

## 2017-09-17 ENCOUNTER — Encounter: Payer: Self-pay | Admitting: Podiatry

## 2017-09-24 ENCOUNTER — Ambulatory Visit (INDEPENDENT_AMBULATORY_CARE_PROVIDER_SITE_OTHER): Payer: BLUE CROSS/BLUE SHIELD | Admitting: Podiatry

## 2017-09-24 ENCOUNTER — Encounter: Payer: Self-pay | Admitting: Podiatry

## 2017-09-24 DIAGNOSIS — M722 Plantar fascial fibromatosis: Secondary | ICD-10-CM | POA: Diagnosis not present

## 2017-09-24 NOTE — Progress Notes (Signed)
Subjective:   Patient ID: Donald Tran, male   DOB: 47 y.o.   MRN: [id number]   HPI Patient presents overall doing well and states that he did have some irritation several days ago   ROS      Objective:  Physical Exam  Neurovascular status intact negative Homans sign noted with wound edges well coapted and minimal plantar discomfort noted currently with slight bruising around the incision site but no drainage no erythema edema noted     Assessment:  Doing well post endoscopic surgery left with no indications of significant pathology     Plan:  H&P condition reviewed and recommended the continuation of boot usage gradual shoe usage with continued compression elevation and if any drainage were to occur or changes he is to let us know immediately

## 2018-01-08 ENCOUNTER — Other Ambulatory Visit: Payer: Self-pay | Admitting: Podiatry

## 2018-01-08 ENCOUNTER — Ambulatory Visit (INDEPENDENT_AMBULATORY_CARE_PROVIDER_SITE_OTHER): Payer: BLUE CROSS/BLUE SHIELD

## 2018-01-08 ENCOUNTER — Encounter: Payer: Self-pay | Admitting: Podiatry

## 2018-01-08 ENCOUNTER — Ambulatory Visit: Payer: BLUE CROSS/BLUE SHIELD | Admitting: Podiatry

## 2018-01-08 DIAGNOSIS — M779 Enthesopathy, unspecified: Secondary | ICD-10-CM

## 2018-01-08 DIAGNOSIS — M7750 Other enthesopathy of unspecified foot: Secondary | ICD-10-CM

## 2018-01-08 DIAGNOSIS — M7752 Other enthesopathy of left foot: Secondary | ICD-10-CM | POA: Diagnosis not present

## 2018-01-08 DIAGNOSIS — M79672 Pain in left foot: Secondary | ICD-10-CM

## 2018-01-08 MED ORDER — TRIAMCINOLONE ACETONIDE 10 MG/ML IJ SUSP
10.0000 mg | Freq: Once | INTRAMUSCULAR | Status: AC
  Administered 2018-01-08: 10 mg

## 2018-01-12 NOTE — Progress Notes (Signed)
Subjective:   Patient ID: Donald Tran, male   DOB: 47 y.o.   MRN: [id number]   HPI Patient states that he will is doing really well but he has developed pain on the outside of his left foot and states he does not remember specific injury associated with it   ROS      Objective:  Physical Exam  Neurovascular status intact with patient's left heel doing very well from endoscopic surgery but does have discomfort in the left peroneal insertion with mild fluid around the area     Assessment:  Tendinitis of the perineal complex left with patient noted to have well-healing plantar fascial site left     Plan:  H&P condition reviewed and did careful perineal injection left 3 mg Kenalog 5 mg Xylocaine and advised on ice therapy and if symptoms persist will reappoint  X-rays were negative for fracture or bone pathology

## 2018-04-17 ENCOUNTER — Encounter: Payer: Self-pay | Admitting: Podiatry

## 2018-04-17 ENCOUNTER — Ambulatory Visit: Payer: BLUE CROSS/BLUE SHIELD | Admitting: Podiatry

## 2018-04-17 DIAGNOSIS — M722 Plantar fascial fibromatosis: Secondary | ICD-10-CM

## 2018-04-17 NOTE — Progress Notes (Signed)
Subjective:   Patient ID: Donald Tran, male   DOB: 47 y.o.   MRN: [id number]   HPI Patient states he continues to have pain in the midfoot left and states that it is really bad when he tries to get up after sitting.  He cannot identify exactly where this is not able to push it and find the pain   ROS      Objective:  Physical Exam  Neurovascular status intact muscle strength is adequate patient found to have some kind of problem with the mid arch area left but very difficult to describe with excellent healing from previous surgery     Assessment:  It appears to be some form of the weakness of the plantar fascia with other pathology along this way     Plan:  At this point I have recommended physical therapy to try to strengthen the fascial band and try to regain motion.  I am scheduling him with physical therapist for this to be done for 3 weeks and then we will see the patient back

## 2018-05-26 ENCOUNTER — Ambulatory Visit (INDEPENDENT_AMBULATORY_CARE_PROVIDER_SITE_OTHER): Payer: BLUE CROSS/BLUE SHIELD | Admitting: Mental Health

## 2018-05-26 DIAGNOSIS — F4323 Adjustment disorder with mixed anxiety and depressed mood: Secondary | ICD-10-CM

## 2018-05-26 NOTE — Progress Notes (Signed)
      [facility] Counselor/Therapist Progress Note   Patient ID: Donald Tran, MRN: [id number]  Date: 05/26/2018  Timespent: 45 minutes  Treatment Type: Psychotherapy  Subjective: Intrafamilial tension and problematic communication styles, mostly with texts. Unclear definition of rules for adolescent children. Interrupted sleep. Anxiety causing stomach distress.  Interventions:CBT  Mental Status Exam:   Appearance:   Casual     Behavior:  Appropriate  Motor:  Normal  Speech/Language:   Clear and Coherent  Affect:  Appropriate  Mood:  anxious  Thought process:  Coherent  Thought content:    WDL  Perceptual disturbances:    Normal  Orientation:  Full (Time, Place, and Person)  Attention:  Good  Concentration:  good  Memory:  Immediate  Fund of knowledge:   Good  Insight:    Good  Judgment:   Intact  Impulse Control:  good    Reported Symptoms: anxiety, depression, family issues. Interrupted sleep.  Risk Assessment: Danger to Self:  No Self-injurious Behavior: No Danger to Others: No Duty to Warn:no Physical Aggression / Violence:No  Access to Firearms a concern: No  Gang Involvement:No   Diagnosis:   ICD-10-CM   1. Adjustment disorder with mixed anxiety and depressed mood F43.23      Plan: Improve marital communication, self-care program, decrease anxiety. Set limits and boundaries with chilldren. Increase social life with wife.  Ulice Bold, [place]

## 2018-06-04 ENCOUNTER — Ambulatory Visit (INDEPENDENT_AMBULATORY_CARE_PROVIDER_SITE_OTHER): Payer: BLUE CROSS/BLUE SHIELD | Admitting: Podiatry

## 2018-06-04 ENCOUNTER — Encounter: Payer: Self-pay | Admitting: Podiatry

## 2018-06-04 ENCOUNTER — Telehealth: Payer: Self-pay | Admitting: Podiatry

## 2018-06-04 DIAGNOSIS — M722 Plantar fascial fibromatosis: Secondary | ICD-10-CM | POA: Diagnosis not present

## 2018-06-04 NOTE — Telephone Encounter (Signed)
Pt called and has contacted his insurance and was told the orthotics are covered so he would like to proceed with ordering them.  I told pt I would call him when they come in to schedule an appt to pick them up.Donald Tran

## 2018-06-04 NOTE — Progress Notes (Signed)
Subjective:   Patient ID: Donald Tran, male   DOB: 47 y.o.   MRN: [id number]   HPI Patient states doing well but he is having mild discomfort and wants new orthotics   ROS      Objective:  Physical Exam  Neurovascular status intact muscle strength is adequate with patient found to have mild discomfort in the mid arch area left and moderate depression of the arch secondary to the previous surgery on the plantar fascia     Assessment:  Patient doing well overall but does have moderate change in the arch height left     Plan:  Recasted him for orthotics at the current time and will have a new pair made for him to be softer material to try to reduce the stress on his feet.  Reappoint to ped orthotist when they are ready

## 2018-06-09 ENCOUNTER — Ambulatory Visit (INDEPENDENT_AMBULATORY_CARE_PROVIDER_SITE_OTHER): Payer: BLUE CROSS/BLUE SHIELD | Admitting: Mental Health

## 2018-06-09 DIAGNOSIS — F4323 Adjustment disorder with mixed anxiety and depressed mood: Secondary | ICD-10-CM

## 2018-06-09 NOTE — Progress Notes (Signed)
      [facility] Counselor/Therapist Progress Note   Patient ID: Donald Tran, MRN: [id number]  Date: 06/09/2018  Timespent: 45 minutes  Treatment Type: Individual  Subjective: Stressful family environment with wife and daughters with wife trying to control all the interactions between daughter and her boyfriend, Wife is constantly critical and constantly texting daughter negative commentaries about the boyfriend. Donald Tran gets ambushed at work with multiple texts. Causes him anxiety and stress and interrupts his job.   Interventions:CBT, Supportive and Family Systems  Mental Status Exam:   Appearance:   Well Groomed     Behavior:  Agitated  Motor:  Normal  Speech/Language:   Clear and Coherent  Affect:  Appropriate  Mood:  angry, anxious and discouraged  Thought process:  Intact  Thought content:    WDL and Logical  Perceptual disturbances:    Normal  Orientation:  Full (Time, Place, and Person)  Attention:  Good  Concentration:  good  Memory:  Immediate  Fund of knowledge:   Good  Insight:    Good  Judgment:   Good  Impulse Control:  good    Reported Symptoms: Feeling very worried, stressed and discouraged at lack of progress in establishing reasonable boundaries within family.                                        Discouraged and feeling hopeless. Risk Assessment: Danger to Self:  No Self-injurious Behavior: No Danger to Others: No Duty to Warn:no Physical Aggression / Violence:No  Access to Firearms a concern: No  Gang Involvement:No   Diagnosis:   ICD-10-CM   1. Adjustment disorder with mixed anxiety and depressed mood F43.23      Plan:          Improve marital communication         Boundaries and setting limits in family         Self-care program         Improve relationship with daughter one on one.         Return to office in two weeks.  Ulice Bold, [place]

## 2018-06-11 ENCOUNTER — Ambulatory Visit: Payer: BLUE CROSS/BLUE SHIELD | Admitting: Mental Health

## 2018-06-15 ENCOUNTER — Ambulatory Visit: Payer: BLUE CROSS/BLUE SHIELD | Admitting: Orthotics

## 2018-06-15 DIAGNOSIS — M779 Enthesopathy, unspecified: Secondary | ICD-10-CM

## 2018-06-15 DIAGNOSIS — M2021 Hallux rigidus, right foot: Secondary | ICD-10-CM

## 2018-06-15 DIAGNOSIS — M722 Plantar fascial fibromatosis: Secondary | ICD-10-CM

## 2018-06-15 NOTE — Progress Notes (Signed)
Patient came in today to pick up custom made foot orthotics.  The goals were accomplished and the patient reported no dissatisfaction with said orthotics.  Patient was advised of breakin period and how to report any issues. 

## 2018-06-25 ENCOUNTER — Ambulatory Visit: Payer: BLUE CROSS/BLUE SHIELD | Admitting: Mental Health

## 2018-07-06 ENCOUNTER — Ambulatory Visit (INDEPENDENT_AMBULATORY_CARE_PROVIDER_SITE_OTHER): Payer: BLUE CROSS/BLUE SHIELD | Admitting: Mental Health

## 2018-07-06 DIAGNOSIS — F4323 Adjustment disorder with mixed anxiety and depressed mood: Secondary | ICD-10-CM | POA: Diagnosis not present

## 2018-07-06 NOTE — Progress Notes (Signed)
      [facility] Counselor/Therapist Progress Note   Patient ID: Emilee HeroChristopher D Penn, MRN: [id number]  Date: 07/06/2018  Timespent: 45 minutes  Treatment Type: Individual   Reported Symptoms: anxiety   Mental Status Exam:    Appearance:   Casual     Behavior:  Appropriate and Sharing  Motor:  Normal  Speech/Language:   Clear and Coherent  Affect:  anxious  Mood:  anxious  Thought process:  normal  Thought content:    WNL  Sensory/Perceptual disturbances:    WNL  Orientation:  oriented to person, place and time/date  Attention:  Good  Concentration:  Good  Memory:  WNL  Fund of knowledge:   Good  Insight:    Good  Judgment:   Good  Impulse Control:  Good     Risk Assessment: Danger to Self:  No Self-injurious Behavior: No Danger to Others: No Duty to Warn:no Physical Aggression / Violence:No  Access to Firearms a concern: No  Gang Involvement:No    Subjective:  Stress of recent job change within company.  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 Wife is sufferiing from karpal tunne and needs to change her business to less strenuous.   Interventions: Cognitive Behavioral Therapy, Solution-Oriented/Positive Psychology, Family Systems and Interpersonal   Diagnosis:   ICD-10-CM   1. Adjustment disorder with mixed anxiety and depressed mood F43.23      Plan:     Marital communication   Self-care program   Improve self confidence   Seek opportunities for support and validation   Ulice Boldarson Takeela Peil, [facility]

## 2018-07-23 ENCOUNTER — Ambulatory Visit: Payer: BLUE CROSS/BLUE SHIELD | Admitting: Mental Health

## 2018-08-06 ENCOUNTER — Ambulatory Visit (INDEPENDENT_AMBULATORY_CARE_PROVIDER_SITE_OTHER): Payer: BLUE CROSS/BLUE SHIELD | Admitting: Mental Health

## 2018-08-06 ENCOUNTER — Encounter: Payer: Self-pay | Admitting: Mental Health

## 2018-08-06 DIAGNOSIS — F4323 Adjustment disorder with mixed anxiety and depressed mood: Secondary | ICD-10-CM | POA: Diagnosis not present

## 2018-08-06 NOTE — Progress Notes (Signed)
      [facility] Counselor/Therapist Progress Note  Patient ID: Donald Tran, MRN: [id number],    Date: 08/06/2018  Time Spent: 45 minutes  Treatment Type: Individual Therapy  Reported Symptoms: Anxious Mood  Mental Status Exam:  Appearance:   Casual     Behavior:  Appropriate and Sharing  Motor:  Normal  Speech/Language:   Normal Rate  Affect:  Appropriate  Mood:  normal  Thought process:  normal  Thought content:    WNL  Sensory/Perceptual disturbances:    WNL  Orientation:  oriented to person, place and time/date  Attention:  Good  Concentration:  Good  Memory:  WNL  Fund of knowledge:   Good  Insight:    Good  Judgment:   Good  Impulse Control:  Good   Risk Assessment: Danger to Self:  No Self-injurious Behavior: No Danger to Others: No Duty to Warn:no Physical Aggression / Violence:No  Access to Firearms a concern: No  Gang Involvement:No   Subjective:  Family issues with teenage daughters keeps wife upset. Need to boost marital satisfaction. Issues with insufficient boundaries.  Interventions: Cognitive Behavioral Therapy, 12-Step, Insight-Oriented, Family Systems and Interpersonal  Diagnosis:   ICD-10-CM   1. Adjustment disorder with mixed anxiety and depressed mood F43.23     Plan: Marital communication           Self-care program           Internal locus of control           Validation and support  Donald Tran, [facility]

## 2018-09-03 ENCOUNTER — Encounter: Payer: Self-pay | Admitting: Mental Health

## 2018-09-03 ENCOUNTER — Ambulatory Visit (INDEPENDENT_AMBULATORY_CARE_PROVIDER_SITE_OTHER): Payer: BLUE CROSS/BLUE SHIELD | Admitting: Mental Health

## 2018-09-03 DIAGNOSIS — F4323 Adjustment disorder with mixed anxiety and depressed mood: Secondary | ICD-10-CM

## 2018-09-03 NOTE — Progress Notes (Signed)
      [facility] Counselor/Therapist Progress Note  Patient ID: Donald Tran, MRN: [id number],    Date: 09/03/2018  Time Spent: 45 minutes  Treatment Type: Individual Therapy  Reported Symptoms: Anxious Mood  Mental Status Exam:  Appearance:   Well Groomed     Behavior:  Appropriate and Sharing  Motor:  Normal  Speech/Language:   Normal Rate  Affect:  anxious  Mood:  anxious  Thought process:  normal  Thought content:    WNL  Sensory/Perceptual disturbances:    WNL  Orientation:  oriented to person, place and time/date  Attention:  Good  Concentration:  Good  Memory:  WNL  Fund of knowledge:   Good  Insight:    Good  Judgment:   Good  Impulse Control:  Good   Risk Assessment: Danger to Self:  No Self-injurious Behavior: No Danger to Others: No Duty to Warn:no Physical Aggression / Violence:No  Access to Firearms a concern: No  Gang Involvement:No   Subjective:    Interventions: Cognitive Behavioral Therapy, Solution-Oriented/Positive Psychology, Insight-Oriented, Family Systems, Interpersonal and suportive  Diagnosis:   ICD-10-CM   1. Adjustment disorder with mixed anxiety and depressed mood F43.23     Plan:  Marital communication            Financial goal setting            Boundaries/ setting limits            Validation and support            RTO 1 month  Ulice Bold, [place]

## 2018-10-01 ENCOUNTER — Ambulatory Visit: Payer: BLUE CROSS/BLUE SHIELD | Admitting: Mental Health

## 2018-10-05 ENCOUNTER — Encounter: Payer: Self-pay | Admitting: Mental Health

## 2018-10-05 ENCOUNTER — Ambulatory Visit (INDEPENDENT_AMBULATORY_CARE_PROVIDER_SITE_OTHER): Payer: BLUE CROSS/BLUE SHIELD | Admitting: Mental Health

## 2018-10-05 DIAGNOSIS — F4323 Adjustment disorder with mixed anxiety and depressed mood: Secondary | ICD-10-CM | POA: Diagnosis not present

## 2018-10-05 NOTE — Progress Notes (Signed)
      [facility] Counselor/Therapist Progress Note  Patient ID: Donald Tran, MRN: [id number],    Date: 10/05/2018  Time Spent: 45 minutes  Treatment Type: Individual Therapy  Reported Symptoms: Depressed mood, Anxious Mood and Fatigue  Mental Status Exam:  Appearance:   Well Groomed     Behavior:  Appropriate and Sharing  Motor:  Normal  Speech/Language:   Normal Rate  Affect:  Full Range  Mood:  anxious  Thought process:  normal  Thought content:    WNL  Sensory/Perceptual disturbances:    WNL  Orientation:  oriented to person, place and time/date  Attention:  Good  Concentration:  Good  Memory:  WNL  Fund of knowledge:   Good  Insight:    Good  Judgment:   Good  Impulse Control:  Good   Risk Assessment: Danger to Self:  No Self-injurious Behavior: No Danger to Others: No Duty to Warn:no Physical Aggression / Violence:No  Access to Firearms a concern: No  Gang Involvement:No   Subjective:   Issues at home with wife having numerous physical problems that are job related, Issues with teenage daughter's relationship. Nervous and feeling misunderstood by family.                                    Interventions: Cognitive Behavioral Therapy, Solution-Oriented/Positive Psychology, Insight-Oriented, Family Systems and Interpersonal  Diagnosis:   ICD-10-CM   1. Adjustment disorder with mixed anxiety and depressed mood F43.23     Plan:    Marital communication              Self care program              Boundaries / setting limits              Validation and support                 Ulice Bold, [facility]

## 2018-11-03 ENCOUNTER — Ambulatory Visit: Payer: BLUE CROSS/BLUE SHIELD | Admitting: Mental Health

## 2019-04-14 ENCOUNTER — Other Ambulatory Visit: Payer: Self-pay | Admitting: Family Medicine

## 2019-04-14 DIAGNOSIS — R079 Chest pain, unspecified: Secondary | ICD-10-CM

## 2019-04-14 NOTE — Progress Notes (Signed)
Heat Exhaustion: States that back in early July pt worked outside for an extended period of time and exerted himself heavily. Felt very weak by the end of the day with feelings of chest heaviness. Pt states it took him almost a week to have his energy back and to no longer feel week. Even mild exertion during htat time gave him some dyspnea. No further episodes.  Chest tightness. Substernal w/ radiation to back. Heavy feeling. Typically while sitting at desk working. Thinks this may be from stress. Sx resolve as he takes a break and goes for a walk. Non-exertional.

## 2019-05-14 ENCOUNTER — Other Ambulatory Visit (HOSPITAL_COMMUNITY): Payer: BLUE CROSS/BLUE SHIELD

## 2019-09-14 ENCOUNTER — Other Ambulatory Visit (HOSPITAL_COMMUNITY): Payer: BLUE CROSS/BLUE SHIELD

## 2019-11-29 ENCOUNTER — Ambulatory Visit: Payer: BLUE CROSS/BLUE SHIELD

## 2019-11-30 ENCOUNTER — Telehealth: Payer: Self-pay | Admitting: Internal Medicine

## 2019-11-30 NOTE — Telephone Encounter (Signed)
Called and spoke with pt letting him know that we could not upload the sleep study results in mychart but stated to him that we could mail it to him. Pt stated that we could fax the results to him at [phone]. I have faxed pt's sleep study results to the provided fax number by pt.

## 2019-12-01 ENCOUNTER — Telehealth: Payer: Self-pay | Admitting: Internal Medicine

## 2019-12-01 NOTE — Telephone Encounter (Signed)
Spoke with pt. Advised that I tried to refax the HST report and fax kept saying recipient busy. PT requested to come by office to pick up a copy. Copy placed at front for pick up. Nothing further needed.

## 2019-12-20 ENCOUNTER — Ambulatory Visit: Payer: BC Managed Care – PPO | Attending: Neurology

## 2019-12-20 DIAGNOSIS — G4733 Obstructive sleep apnea (adult) (pediatric): Secondary | ICD-10-CM | POA: Insufficient documentation

## 2019-12-23 ENCOUNTER — Other Ambulatory Visit: Payer: Self-pay

## 2019-12-30 ENCOUNTER — Telehealth: Payer: Self-pay | Admitting: Internal Medicine

## 2019-12-30 NOTE — Telephone Encounter (Signed)
I called Donald Tran but she was not there. Will try again tomorrow.

## 2019-12-31 NOTE — Telephone Encounter (Signed)
Phineas Semen returned call.  Please call [phone]

## 2019-12-31 NOTE — Telephone Encounter (Signed)
LMTCB for [employer]

## 2019-12-31 NOTE — Telephone Encounter (Signed)
[facility] [place] LMTCB X2

## 2020-01-03 NOTE — Telephone Encounter (Signed)
LMOVM and closing per protocol

## 2020-04-14 ENCOUNTER — Encounter: Payer: Self-pay | Admitting: Urology

## 2020-04-14 ENCOUNTER — Other Ambulatory Visit: Payer: Self-pay

## 2020-04-14 ENCOUNTER — Ambulatory Visit (INDEPENDENT_AMBULATORY_CARE_PROVIDER_SITE_OTHER): Payer: BC Managed Care – PPO | Admitting: Urology

## 2020-04-14 VITALS — BP 145/88 | HR 79 | Ht 69.0 in | Wt 190.0 lb

## 2020-04-14 DIAGNOSIS — R41840 Attention and concentration deficit: Secondary | ICD-10-CM

## 2020-04-14 DIAGNOSIS — R5382 Chronic fatigue, unspecified: Secondary | ICD-10-CM

## 2020-04-14 DIAGNOSIS — R6882 Decreased libido: Secondary | ICD-10-CM

## 2020-04-14 NOTE — Progress Notes (Signed)
04/14/2020 11:04 AM   Donald Tran Aug 08, 1971 [id number]  Referring provider: Ozella Rocks, MD [address] [place],  [place] [address]  Chief Complaint  Patient presents with  . Other    HPI: Donald Tran is a 49 y.o. male seen at the request of Kirby Funk, MD for evaluation of possible hypogonadism.   Saw employer physician February 2021 with a 1 year history of significantly decreased libido, tiredness/fatigue, loss of desire of normal activities and difficulty concentrating  Total testosterone level low normal at 372 ng/dL  Over the last 6 months feels his symptoms have worsened  Mild ED  Has also noted decreased force and caliber of urinary stream and double voiding  No dysuria, gross hematuria  No flank, abdominal or pelvic pain  Sleep apnea with recently implanted nerve stimulator   PMH: Past Medical History:  Diagnosis Date  . Chronic back pain   . History of stomach ulcers   . Hyperlipidemia   . Kidney stones   . OSA (obstructive sleep apnea)     Surgical History: Past Surgical History:  Procedure Laterality Date  . VASECTOMY      Home Medications:  Allergies as of 04/14/2020      Reactions   Diclofenac    Upset stomach      Medication List       Accurate as of April 14, 2020 11:04 AM. If you have any questions, ask your nurse or doctor.        cetirizine 10 MG tablet Commonly known as: ZYRTEC Take 10 mg by mouth daily.   eszopiclone 2 MG Tabs tablet Commonly known as: LUNESTA TAKE 1/2 TO 1 TABLET BY MOUTH AT BEDTIME IF NEEDED   gabapentin 300 MG capsule Commonly known as: NEURONTIN gabapentin 300 mg capsule  TAKE 1 CAPSULE BY MOUTH EVERY DAY AS NEEDED   HYDROcodone-acetaminophen 10-325 MG tablet Commonly known as: NORCO Take 1 tablet by mouth every 6 (six) hours as needed. Dispensed 20 with no refills on 09/02/17   meloxicam 15 MG tablet Commonly known as: MOBIC Take 1 tablet (15 mg total) by mouth daily.     pramipexole 0.125 MG tablet Commonly known as: MIRAPEX Take 0.125 mg by mouth at bedtime.   pravastatin 20 MG tablet Commonly known as: PRAVACHOL Take 20 mg by mouth daily.   simvastatin 40 MG tablet Commonly known as: ZOCOR Take 40 mg by mouth daily.       Allergies:  Allergies  Allergen Reactions  . Diclofenac     Upset stomach    Family History: Family History  Problem Relation Age of Onset  . Kidney disease Neg Hx   . Prostate cancer Neg Hx     Social History:  reports that he has never smoked. He has never used smokeless tobacco. He reports current alcohol use. He reports current drug use.   Physical Exam: BP (!) 145/88   Pulse 79   Ht 5\' 9"  (1.753 m)   Wt 190 lb (86.2 kg)   BMI 28.06 kg/m   Constitutional:  Alert and oriented, No acute distress. HEENT: [place] AT, moist mucus membranes.  Trachea midline, no masses. Cardiovascular: No clubbing, cyanosis, or edema. Respiratory: Normal respiratory effort, no increased work of breathing. GI: Abdomen is soft, nontender, nondistended, no abdominal masses GU: Phallus circumcised without lesions, testes descended bilaterally without masses or tenderness; volume 15 cc bilaterally; spermatic cord/epididymis palpably normal bilaterally.  Prostate 40 g, smooth without nodules Skin: No rashes, bruises  or suspicious lesions. Neurologic: Grossly intact, no focal deficits, moving all 4 extremities. Psychiatric: Normal mood and affect.   Assessment & Plan:    1.  Symptoms of hypogonadism  Total testosterone 09/2019 low normal  Repeat free/total testosterone  LH  Prolactin  Discussed insurance companies typically required to abnormal levels before coverage  Potential side effects of testosterone replacement were discussed including stimulation of benign prostatic growth with lower urinary tract symptoms; erythrocytosis; edema; gynecomastia; worsening sleep apnea; venous thromboembolism; testicular atrophy and  infertility. Recent studies suggesting an increased incidence of heart attack and stroke in patients taking testosterone was discussed. He was informed there is conflicting evidence regarding the impact of testosterone therapy on cardiovascular risk. The theoretical risk of growth stimulation of an undetected prostate cancer was also discussed.  He was informed that current evidence does not provide any definitive answers regarding the risks of testosterone therapy on prostate cancer and cardiovascular disease. The need for periodic monitoring of his testosterone level, PSA, hematocrit and DRE was discussed.    Riki Altes, MD  [facility] [address] [place], [place] [address] [phone]

## 2020-04-15 LAB — TESTOSTERONE FREE, PROFILE I
Albumin: 4.8 g/dL (ref 4.0–5.0)
Sex Hormone Binding: 20.1 nmol/L (ref 16.5–55.9)
Testost., Free, Calc: 60 pg/mL (ref 30.3–183.2)
Testosterone: 256 ng/dL — ABNORMAL LOW (ref 264–916)

## 2020-04-15 LAB — TESTOSTERONE: Testosterone: 275 ng/dL (ref 264–916)

## 2020-04-15 LAB — LUTEINIZING HORMONE: LH: 8.7 m[IU]/mL — ABNORMAL HIGH (ref 1.7–8.6)

## 2020-04-15 LAB — PROLACTIN: Prolactin: 8.6 ng/mL (ref 4.0–15.2)

## 2020-04-17 ENCOUNTER — Telehealth: Payer: Self-pay | Admitting: *Deleted

## 2020-04-17 NOTE — Telephone Encounter (Signed)
Left message need to be scheduled for lab .

## 2020-04-17 NOTE — Telephone Encounter (Signed)
-----   Message from Riki Altes, MD sent at 04/16/2020  2:27 PM EDT ----- Total testosterone level low at 256.  LH was slightly elevated and prolactin normal.  Repeat a.m. lab visit for total testosterone

## 2020-04-18 ENCOUNTER — Other Ambulatory Visit: Payer: Self-pay

## 2020-04-18 DIAGNOSIS — R6882 Decreased libido: Secondary | ICD-10-CM

## 2020-04-19 ENCOUNTER — Other Ambulatory Visit: Payer: BC Managed Care – PPO

## 2020-04-19 ENCOUNTER — Other Ambulatory Visit: Payer: Self-pay

## 2020-04-19 ENCOUNTER — Ambulatory Visit: Payer: BC Managed Care – PPO | Admitting: Urology

## 2020-04-19 DIAGNOSIS — R6882 Decreased libido: Secondary | ICD-10-CM

## 2020-04-20 ENCOUNTER — Telehealth: Payer: Self-pay | Admitting: *Deleted

## 2020-04-20 LAB — PSA: Prostate Specific Ag, Serum: 1.4 ng/mL (ref 0.0–4.0)

## 2020-04-20 LAB — TESTOSTERONE: Testosterone: 328 ng/dL (ref 264–916)

## 2020-04-20 NOTE — Telephone Encounter (Signed)
Notified patient as instructed, patient pleased. Discussed follow-up appointments, patient agrees  

## 2020-04-20 NOTE — Telephone Encounter (Signed)
-----   Message from Riki Altes, MD sent at 04/20/2020  7:42 AM EDT ----- Repeat testosterone level was in the low normal range.  Unless he wants to pay out-of-pocket for testosterone replacement we will need to repeat another a.m. testosterone level next week

## 2020-05-03 ENCOUNTER — Other Ambulatory Visit: Payer: Self-pay

## 2020-05-03 DIAGNOSIS — R6882 Decreased libido: Secondary | ICD-10-CM

## 2020-05-04 ENCOUNTER — Other Ambulatory Visit: Payer: Self-pay

## 2020-05-04 ENCOUNTER — Other Ambulatory Visit: Payer: BC Managed Care – PPO

## 2020-05-04 DIAGNOSIS — R6882 Decreased libido: Secondary | ICD-10-CM

## 2020-05-05 LAB — TESTOSTERONE: Testosterone: 306 ng/dL (ref 264–916)

## 2020-05-08 ENCOUNTER — Telehealth: Payer: Self-pay | Admitting: *Deleted

## 2020-05-08 ENCOUNTER — Encounter: Payer: Self-pay | Admitting: Urology

## 2020-05-08 NOTE — Telephone Encounter (Signed)
Please will come in for lab work next month

## 2020-05-08 NOTE — Telephone Encounter (Signed)
-----   Message from Riki Altes, MD sent at 05/06/2020 12:56 PM EDT ----- Testosterone still in the low normal range.  We can continue to periodically check the testosterone level and hope to catch it again in the abnormal range or can start testosterone replacement but he would most likely have to pay out-of-pocket

## 2020-05-09 ENCOUNTER — Telehealth: Payer: Self-pay | Admitting: *Deleted

## 2020-05-09 DIAGNOSIS — E291 Testicular hypofunction: Secondary | ICD-10-CM

## 2020-05-09 MED ORDER — TESTOSTERONE 20.25 MG/ACT (1.62%) TD GEL: TRANSDERMAL | 1 refills | Status: DC

## 2020-05-09 NOTE — Telephone Encounter (Signed)
Patient states he would like to try and get approved for the testosterone . He wants to know which medication will be better for him . The pill, gel or the once a week shot or every two weeks shot. He would like this sent to [facility] .

## 2020-05-09 NOTE — Telephone Encounter (Signed)
There is not really a best choice and typically depends on patient preference and what insurance covers.  I went ahead and sent in a prescription for testosterone gel but it may be denied

## 2020-05-10 ENCOUNTER — Telehealth: Payer: Self-pay | Admitting: *Deleted

## 2020-05-10 NOTE — Telephone Encounter (Signed)
Your PA request has been approved.  

## 2020-06-06 ENCOUNTER — Encounter: Payer: Self-pay | Admitting: Urology

## 2020-06-06 ENCOUNTER — Other Ambulatory Visit: Payer: Self-pay | Admitting: Family Medicine

## 2020-06-06 DIAGNOSIS — E291 Testicular hypofunction: Secondary | ICD-10-CM

## 2020-06-07 ENCOUNTER — Other Ambulatory Visit: Payer: BC Managed Care – PPO

## 2020-06-07 ENCOUNTER — Other Ambulatory Visit: Payer: Self-pay

## 2020-06-07 DIAGNOSIS — E291 Testicular hypofunction: Secondary | ICD-10-CM

## 2020-06-08 LAB — VITAMIN D 25 HYDROXY (VIT D DEFICIENCY, FRACTURES): Vit D, 25-Hydroxy: 27.3 ng/mL — ABNORMAL LOW (ref 30.0–100.0)

## 2020-06-08 LAB — TESTOSTERONE: Testosterone: 555 ng/dL (ref 264–916)

## 2020-06-26 ENCOUNTER — Encounter: Payer: Self-pay | Admitting: Urology

## 2020-06-30 ENCOUNTER — Other Ambulatory Visit: Payer: Self-pay | Admitting: *Deleted

## 2020-06-30 MED ORDER — CLOMIPHENE CITRATE 50 MG PO TABS: ORAL_TABLET | ORAL | 1 refills | Status: AC

## 2020-07-08 ENCOUNTER — Other Ambulatory Visit: Payer: Self-pay | Admitting: Urology

## 2020-08-23 ENCOUNTER — Other Ambulatory Visit: Payer: Self-pay | Admitting: *Deleted

## 2020-08-23 ENCOUNTER — Encounter: Payer: Self-pay | Admitting: Urology

## 2020-08-23 DIAGNOSIS — E291 Testicular hypofunction: Secondary | ICD-10-CM

## 2020-08-23 DIAGNOSIS — R5382 Chronic fatigue, unspecified: Secondary | ICD-10-CM

## 2020-08-24 ENCOUNTER — Other Ambulatory Visit: Payer: 59

## 2020-08-24 ENCOUNTER — Other Ambulatory Visit: Payer: Self-pay

## 2020-08-24 DIAGNOSIS — R5382 Chronic fatigue, unspecified: Secondary | ICD-10-CM

## 2020-08-24 DIAGNOSIS — E291 Testicular hypofunction: Secondary | ICD-10-CM

## 2020-08-25 ENCOUNTER — Encounter: Payer: Self-pay | Admitting: Urology

## 2020-08-28 ENCOUNTER — Telehealth: Payer: Self-pay | Admitting: Family Medicine

## 2020-08-28 NOTE — Telephone Encounter (Signed)
Patient notified and states he had blood work done for work and has a recent HCT. He will bring it to his follow up appointment.

## 2020-08-28 NOTE — Telephone Encounter (Signed)
-----   Message from Riki Altes, MD sent at 08/27/2020 10:41 AM EST ----- Testosterone level was normal at 637.  He has not been seen since starting testosterone therapy and September.  He needs a follow-up office visit and also a hematocrit

## 2020-08-31 LAB — TESTOSTERONE: Testosterone: 637 ng/dL (ref 264–916)

## 2020-08-31 LAB — VITAMIN D 1,25 DIHYDROXY
Vitamin D 1, 25 (OH)2 Total: 56 pg/mL
Vitamin D2 1, 25 (OH)2: <10 pg/mL
Vitamin D3 1, 25 (OH)2: 56 pg/mL

## 2020-09-05 ENCOUNTER — Encounter: Payer: Self-pay | Admitting: Urology

## 2020-09-13 ENCOUNTER — Other Ambulatory Visit: Payer: 59

## 2020-09-20 ENCOUNTER — Other Ambulatory Visit: Payer: Self-pay

## 2020-09-20 ENCOUNTER — Ambulatory Visit: Payer: 59 | Admitting: Urology

## 2020-09-20 ENCOUNTER — Encounter: Payer: Self-pay | Admitting: Urology

## 2020-09-20 VITALS — BP 133/75 | HR 77 | Ht 69.0 in | Wt 201.0 lb

## 2020-09-20 DIAGNOSIS — E291 Testicular hypofunction: Secondary | ICD-10-CM | POA: Diagnosis not present

## 2020-09-20 NOTE — Progress Notes (Signed)
09/20/2020 8:38 AM   Donald Tran [birth year] [id number]  Referring provider: Ozella Rocks, MD [address] [place],  [place] [address]  Chief Complaint  Patient presents with  . Hypogonadism    HPI: 50 y.o. male presents for follow-up of hypogonadism.   Initially seen 04/14/2020 with symptoms of significant tiredness, fatigue, decreased libido and difficulty concentrating  Testosterone level 256 ng/dL  Initially started on testosterone gel and follow-up T level on replacement was 555 ng/dL  He noted improvement in his energy level but libido remains decreased  He requested to switch to Clomid and was started in November 2021  Feels libido is better on Clomid but still decreased   Testosterone level 08/24/2020 was 637  Began to have increased anxiety with symptoms of increased stress, tachycardia since he started the Clomid and elected to decrease the dose to 12.5 mg daily which he has been on ~2 weeks  PSA September 2021 was 1.4   PMH: Past Medical History:  Diagnosis Date  . Chronic back pain   . History of stomach ulcers   . Hyperlipidemia   . Kidney stones   . OSA (obstructive sleep apnea)     Surgical History: Past Surgical History:  Procedure Laterality Date  . VASECTOMY      Home Medications:  Allergies as of 09/20/2020      Reactions   Diclofenac    Upset stomach      Medication List       Accurate as of September 20, 2020  8:38 AM. If you have any questions, ask your nurse or doctor.        cetirizine 10 MG tablet Commonly known as: ZYRTEC Take 10 mg by mouth daily.   clomiPHENE 50 MG tablet Commonly known as: CLOMID Take one half tab daily   eszopiclone 2 MG Tabs tablet Commonly known as: LUNESTA TAKE 1/2 TO 1 TABLET BY MOUTH AT BEDTIME IF NEEDED   gabapentin 300 MG capsule Commonly known as: NEURONTIN gabapentin 300 mg capsule  TAKE 1 CAPSULE BY MOUTH EVERY DAY AS NEEDED   Testosterone 20.25 MG/ACT (1.62%)  Gel Apply 1 pump to each shoulder daily       Allergies:  Allergies  Allergen Reactions  . Diclofenac     Upset stomach    Family History: Family History  Problem Relation Age of Onset  . Kidney disease Neg Hx   . Prostate cancer Neg Hx     Social History:  reports that he has never smoked. He has never used smokeless tobacco. He reports current alcohol use. He reports current drug use.   Physical Exam: BP 133/75   Pulse 77   Ht 5\' 9"  (1.753 m)   Wt 201 lb (91.2 kg)   BMI 29.68 kg/m   Constitutional:  Alert, No acute distress. HEENT: [place] AT, moist mucus membranes.  Trachea midline, no masses. Cardiovascular: No clubbing, cyanosis, or edema. Respiratory: Normal respiratory effort, no increased work of breathing. Skin: No rashes, bruises or suspicious lesions. Neurologic: Grossly intact, no focal deficits, moving all 4 extremities. Psychiatric: Normal mood and affect.   Assessment & Plan:    1.  Hypogonadism  He decreased the dose of Clomid 2 weeks ago due to increased anxiety to see if this made a difference  Taking one quarter of a tab daily and we discussed the option of one half tab every other day which may be easier  Lab visit for testosterone level in  1 month   Riki Altes, MD  [facility] [address] [place], [place] [address] [phone]

## 2020-10-18 ENCOUNTER — Other Ambulatory Visit: Payer: Self-pay

## 2020-10-18 ENCOUNTER — Other Ambulatory Visit: Payer: 59

## 2020-10-18 DIAGNOSIS — E291 Testicular hypofunction: Secondary | ICD-10-CM

## 2020-10-19 LAB — TESTOSTERONE: Testosterone: 557 ng/dL (ref 264–916)

## 2020-10-23 ENCOUNTER — Encounter: Payer: Self-pay | Admitting: *Deleted

## 2021-03-06 ENCOUNTER — Other Ambulatory Visit: Payer: Self-pay | Admitting: *Deleted

## 2021-03-06 DIAGNOSIS — E291 Testicular hypofunction: Secondary | ICD-10-CM

## 2021-03-06 DIAGNOSIS — R972 Elevated prostate specific antigen [PSA]: Secondary | ICD-10-CM

## 2021-03-13 ENCOUNTER — Other Ambulatory Visit: Payer: Self-pay

## 2021-03-13 MED ORDER — CLENPIQ 10-3.5-12 MG-GM -GM/160ML PO SOLN: 320.0000 mL | ORAL | 0 refills | Status: AC

## 2021-04-11 ENCOUNTER — Telehealth: Payer: Self-pay

## 2021-04-11 NOTE — Telephone Encounter (Signed)
LVM for pt to return my call regarding questions about bowel prep.

## 2021-04-11 NOTE — Telephone Encounter (Signed)
Pt returned call and bowel prep instructions were discussed.

## 2021-04-12 ENCOUNTER — Encounter: Payer: Self-pay | Admitting: Gastroenterology

## 2021-04-12 ENCOUNTER — Ambulatory Visit: Payer: 59 | Admitting: Certified Registered"

## 2021-04-12 ENCOUNTER — Ambulatory Visit
Admission: RE | Admit: 2021-04-12 | Discharge: 2021-04-12 | Disposition: A | Discharge status: Home / Self Care | Payer: 59 | Attending: Gastroenterology | Admitting: Gastroenterology

## 2021-04-12 ENCOUNTER — Encounter
Admission: RE | Disposition: A | Discharge status: Home / Self Care | Payer: Self-pay | Source: Home / Self Care | Attending: Gastroenterology

## 2021-04-12 DIAGNOSIS — Z79899 Other long term (current) drug therapy: Secondary | ICD-10-CM | POA: Insufficient documentation

## 2021-04-12 DIAGNOSIS — Z888 Allergy status to other drugs, medicaments and biological substances status: Secondary | ICD-10-CM | POA: Insufficient documentation

## 2021-04-12 DIAGNOSIS — K648 Other hemorrhoids: Secondary | ICD-10-CM | POA: Diagnosis not present

## 2021-04-12 DIAGNOSIS — Z1211 Encounter for screening for malignant neoplasm of colon: Secondary | ICD-10-CM

## 2021-04-12 DIAGNOSIS — E785 Hyperlipidemia, unspecified: Secondary | ICD-10-CM | POA: Diagnosis not present

## 2021-04-12 DIAGNOSIS — K5730 Diverticulosis of large intestine without perforation or abscess without bleeding: Secondary | ICD-10-CM | POA: Insufficient documentation

## 2021-04-12 DIAGNOSIS — G4733 Obstructive sleep apnea (adult) (pediatric): Secondary | ICD-10-CM | POA: Diagnosis not present

## 2021-04-12 DIAGNOSIS — Z87442 Personal history of urinary calculi: Secondary | ICD-10-CM | POA: Diagnosis not present

## 2021-04-12 DIAGNOSIS — Z9852 Vasectomy status: Secondary | ICD-10-CM | POA: Diagnosis not present

## 2021-04-12 DIAGNOSIS — Z8711 Personal history of peptic ulcer disease: Secondary | ICD-10-CM | POA: Insufficient documentation

## 2021-04-12 HISTORY — PX: COLONOSCOPY: SHX5424

## 2021-04-12 SURGERY — COLONOSCOPY
Anesthesia: General

## 2021-04-12 MED ORDER — PHENYLEPHRINE HCL (PRESSORS) 10 MG/ML IV SOLN
INTRAVENOUS | Status: AC
  Filled 2021-04-12: qty 1

## 2021-04-12 MED ORDER — SODIUM CHLORIDE 0.9 % IV SOLN: INTRAVENOUS | Status: DC

## 2021-04-12 MED ORDER — PROPOFOL 10 MG/ML IV BOLUS
INTRAVENOUS | Status: DC | PRN
  Administered 2021-04-12: 20 mg via INTRAVENOUS
  Administered 2021-04-12: 70 mg via INTRAVENOUS

## 2021-04-12 MED ORDER — PROPOFOL 500 MG/50ML IV EMUL
INTRAVENOUS | Status: AC
  Filled 2021-04-12: qty 50

## 2021-04-12 MED ORDER — LIDOCAINE HCL (CARDIAC) PF 100 MG/5ML IV SOSY
PREFILLED_SYRINGE | INTRAVENOUS | Status: DC | PRN
  Administered 2021-04-12: 50 mg via INTRAVENOUS

## 2021-04-12 MED ORDER — PROPOFOL 500 MG/50ML IV EMUL
INTRAVENOUS | Status: DC | PRN
  Administered 2021-04-12: 150 ug/kg/min via INTRAVENOUS

## 2021-04-12 NOTE — Transfer of Care (Signed)
Immediate Anesthesia Transfer of Care Note  Patient: Donald Tran  Procedure(s) Performed: COLONOSCOPY  Patient Location: PACU  Anesthesia Type:General  Level of Consciousness: drowsy and patient cooperative  Airway & Oxygen Therapy: Patient Spontanous Breathing  Post-op Assessment: Report given to RN and Post -op Vital signs reviewed and stable  Post vital signs: Reviewed and stable  Last Vitals:  Vitals Value Taken Time  BP 113/71 04/12/21 0753  Temp    Pulse 71 04/12/21 0753  Resp 18 04/12/21 0753  SpO2 97 % 04/12/21 0753  Vitals shown include unvalidated device data.  Last Pain:  Vitals:   04/12/21 0716  TempSrc: Temporal         Complications: No notable events documented.

## 2021-04-12 NOTE — Op Note (Signed)
[facility] Patient Name: Donald Tran Procedure Date: 04/12/2021 7:34 AM MRN: [id number] Account #: [id number] Date of Birth: 1971-07-01 Admit Type: Outpatient Age: 50 Room: [facility] ENDO ROOM 4 Gender: Male Note Status: Finalized Procedure:             Colonoscopy Indications:           Screening for colorectal malignant neoplasm Providers:             Midge Minium MD, MD Referring MD:          Gildardo Pounds, MD (Referring MD) Medicines:             Propofol per Anesthesia Complications:         No immediate complications. Procedure:             Pre-Anesthesia Assessment:                        - Prior to the procedure, a History and Physical was                         performed, and patient medications and allergies were                         reviewed. The patient's tolerance of previous                         anesthesia was also reviewed. The risks and benefits                         of the procedure and the sedation options and risks                         were discussed with the patient. All questions were                         answered, and informed consent was obtained. Prior                         Anticoagulants: The patient has taken no previous                         anticoagulant or antiplatelet agents. ASA Grade                         Assessment: II - A patient with mild systemic disease.                         After reviewing the risks and benefits, the patient                         was deemed in satisfactory condition to undergo the                         procedure.                        After obtaining informed consent, the colonoscope was  passed under direct vision. Throughout the procedure,                         the patient's blood pressure, pulse, and oxygen                         saturations were monitored continuously. The                         Colonoscope was introduced through the  anus and                         advanced to the the cecum, identified by appendiceal                         orifice and ileocecal valve. The colonoscopy was                         performed without difficulty. The patient tolerated                         the procedure well. The quality of the bowel                         preparation was excellent. Findings:      The perianal and digital rectal examinations were normal.      A few small-mouthed diverticula were found in the sigmoid colon.      Non-bleeding internal hemorrhoids were found during retroflexion. The       hemorrhoids were Grade I (internal hemorrhoids that do not prolapse). Impression:            - Diverticulosis in the sigmoid colon.                        - Non-bleeding internal hemorrhoids.                        - No specimens collected. Recommendation:        - Discharge patient to home.                        - Resume previous diet.                        - Continue present medications.                        - Repeat colonoscopy in 10 years for screening                         purposes. Procedure Code(s):     --- Professional ---                        [phone], Colonoscopy, flexible; diagnostic, including                         collection of specimen(s) by brushing or washing, when                         performed (separate procedure)  Diagnosis Code(s):     --- Professional ---                        Z12.11, Encounter for screening for malignant neoplasm                         of colon CPT copyright 2019 [employer] Association. All rights reserved. The codes documented in this report are preliminary and upon coder review may  be revised to meet current compliance requirements. Midge Minium MD, MD 04/12/2021 7:49:31 AM This report has been signed electronically. Number of Addenda: 0 Note Initiated On: 04/12/2021 7:34 AM Scope Withdrawal Time: 0 hours 6 minutes 23 seconds  Total Procedure Duration: 0  hours 8 minutes 40 seconds  Estimated Blood Loss:  Estimated blood loss: none.      [facility]

## 2021-04-12 NOTE — H&P (Signed)
Donald Minium, MD [facility] [address] [place], [place] [address] Phone: [phone] Fax : [phone]  Primary Care Physician:  Donald Rocks, MD Primary Gastroenterologist:  Dr. Servando Tran  Pre-Procedure History & Physical: HPI:  Donald Tran is a 50 y.o. male is here for a screening colonoscopy.   Past Medical History:  Diagnosis Date   Chronic back pain    History of stomach ulcers    Hyperlipidemia    Kidney stones    OSA (obstructive sleep apnea)     Past Surgical History:  Procedure Laterality Date   facia release  Left    inspire implant     VASECTOMY      Prior to Admission medications   Medication Sig Start Date End Date Taking? Authorizing Provider  gabapentin (NEURONTIN) 300 MG capsule gabapentin 300 mg capsule  TAKE 1 CAPSULE BY MOUTH EVERY DAY AS NEEDED   Yes [provider]  Suvorexant (BELSOMRA) 5 MG TABS Take by mouth.   Yes [provider]  cetirizine (ZYRTEC) 10 MG tablet Take 10 mg by mouth daily.    [provider]  clomiPHENE (CLOMID) 50 MG tablet Take one half tab daily 06/30/20   Tran, Donald Czech, MD  eszopiclone (LUNESTA) 2 MG TABS tablet TAKE 1/2 TO 1 TABLET BY MOUTH AT BEDTIME IF NEEDED 12/15/17   [provider]  Sod Picosulfate-Mag Ox-Cit Acd (CLENPIQ) 10-3.5-12 MG-GM -GM/160ML SOLN Take 320 mLs by mouth as directed. 03/13/21   Donald Minium, MD    Allergies as of 03/13/2021 - Review Complete 09/20/2020  Allergen Reaction Noted   Diclofenac  03/27/2017    Family History  Problem Relation Age of Onset   Kidney disease Neg Hx    Prostate cancer Neg Hx     Social History   Socioeconomic History   Marital status: Married    Spouse name: married   Number of children: Y   Years of education: Not on file   Highest education level: Not on file  Occupational History   Occupation: English as a second language teacher: [employer]  Tobacco Use   Smoking status: Never   Smokeless tobacco: Never  Vaping Use    Vaping Use: Never used  Substance and Sexual Activity   Alcohol use: Yes    Alcohol/week: 0.0 standard drinks    Comment: occ   Drug use: Never    Comment: past   Sexual activity: Yes    Partners: Female    Comment: married  Other Topics Concern   Not on file  Social History Narrative   Not on file   Social Determinants of Health   Financial Resource Strain: Not on file  Food Insecurity: Not on file  Transportation Needs: Not on file  Physical Activity: Not on file  Stress: Not on file  Social Connections: Not on file  Intimate Partner Violence: Not on file    Review of Systems: See HPI, otherwise negative ROS  Physical Exam: There were no vitals taken for this visit. General:   Alert,  pleasant and cooperative in NAD Head:  Normocephalic and atraumatic. Neck:  Supple; no masses or thyromegaly. Lungs:  Clear throughout to auscultation.    Heart:  Regular rate and rhythm. Abdomen:  Soft, nontender and nondistended. Normal bowel sounds, without guarding, and without rebound.   Neurologic:  Alert and  oriented x4;  grossly normal neurologically.  Impression/Plan: Donald Tran is now here to undergo a screening colonoscopy.  Risks, benefits, and  alternatives regarding colonoscopy have been reviewed with the patient.  Questions have been answered.  All parties agreeable.

## 2021-04-12 NOTE — Anesthesia Postprocedure Evaluation (Signed)
Anesthesia Post Note  Patient: Donald Tran  Procedure(s) Performed: COLONOSCOPY  Patient location during evaluation: Endoscopy Anesthesia Type: General Level of consciousness: awake and alert Pain management: pain level controlled Vital Signs Assessment: post-procedure vital signs reviewed and stable Respiratory status: spontaneous breathing, nonlabored ventilation, respiratory function stable and patient connected to nasal cannula oxygen Cardiovascular status: blood pressure returned to baseline and stable Postop Assessment: no apparent nausea or vomiting Anesthetic complications: no   No notable events documented.   Last Vitals:  Vitals:   04/12/21 0752 04/12/21 0812  BP: 113/71 122/82  Pulse: 73   Resp: 18   Temp: (!) 36.3 C   SpO2: 97%     Last Pain:  Vitals:   04/12/21 0812  TempSrc:   PainSc: 0-No pain                 Lenard Simmer

## 2021-04-12 NOTE — Anesthesia Preprocedure Evaluation (Signed)
Anesthesia Evaluation  Patient identified by MRN, date of birth, ID band Patient awake    Reviewed: Allergy & Precautions, H&P , NPO status , Patient's Chart, lab work & pertinent test results, reviewed documented beta blocker date and time   History of Anesthesia Complications Negative for: history of anesthetic complications  Airway Mallampati: II  TM Distance: >3 FB Neck ROM: full    Dental  (+) Dental Advidsory Given   Pulmonary neg shortness of breath, sleep apnea , neg COPD, neg recent URI,    Pulmonary exam normal breath sounds clear to auscultation       Cardiovascular Exercise Tolerance: Good negative cardio ROS Normal cardiovascular exam Rhythm:regular Rate:Normal     Neuro/Psych negative neurological ROS  negative psych ROS   GI/Hepatic negative GI ROS, Neg liver ROS,   Endo/Other  negative endocrine ROS  Renal/GU Renal disease (kidney stones)  negative genitourinary   Musculoskeletal   Abdominal   Peds  Hematology negative hematology ROS (+)   Anesthesia Other Findings Past Medical History: No date: Chronic back pain No date: History of stomach ulcers No date: Hyperlipidemia No date: Kidney stones No date: OSA (obstructive sleep apnea)   Reproductive/Obstetrics negative OB ROS                             Anesthesia Physical Anesthesia Plan  ASA: 2  Anesthesia Plan: General   Post-op Pain Management:    Induction: Intravenous  PONV Risk Score and Plan: 2 and TIVA and Propofol infusion  Airway Management Planned: Natural Airway and Nasal Cannula  Additional Equipment:   Intra-op Plan:   Post-operative Plan:   Informed Consent: I have reviewed the patients History and Physical, chart, labs and discussed the procedure including the risks, benefits and alternatives for the proposed anesthesia with the patient or authorized representative who has indicated  his/her understanding and acceptance.     Dental Advisory Given  Plan Discussed with: Anesthesiologist, CRNA and Surgeon  Anesthesia Plan Comments:         Anesthesia Quick Evaluation

## 2021-04-12 NOTE — Anesthesia Procedure Notes (Signed)
Procedure Name: MAC Date/Time: 04/12/2021 7:35 AM Performed by: Jerrye Noble, CRNA Pre-anesthesia Checklist: Patient identified, Emergency Drugs available, Suction available and Patient being monitored Patient Re-evaluated:Patient Re-evaluated prior to induction Oxygen Delivery Method: Nasal cannula

## 2021-04-13 ENCOUNTER — Encounter: Payer: Self-pay | Admitting: Gastroenterology

## 2021-04-25 ENCOUNTER — Other Ambulatory Visit: Payer: Self-pay

## 2021-04-25 ENCOUNTER — Other Ambulatory Visit: Payer: 59

## 2021-04-25 DIAGNOSIS — R972 Elevated prostate specific antigen [PSA]: Secondary | ICD-10-CM

## 2021-04-25 DIAGNOSIS — E291 Testicular hypofunction: Secondary | ICD-10-CM

## 2021-04-26 LAB — HEMATOCRIT: Hematocrit: 48.1 % (ref 37.5–51.0)

## 2021-04-26 LAB — PSA: Prostate Specific Ag, Serum: 2.1 ng/mL (ref 0.0–4.0)

## 2021-04-26 LAB — TESTOSTERONE: Testosterone: 531 ng/dL (ref 264–916)

## 2021-04-27 ENCOUNTER — Ambulatory Visit: Payer: 59 | Admitting: Urology

## 2021-04-27 ENCOUNTER — Encounter: Payer: Self-pay | Admitting: Urology

## 2021-04-27 ENCOUNTER — Other Ambulatory Visit: Payer: Self-pay

## 2021-04-27 VITALS — BP 139/82 | HR 83 | Ht 69.0 in | Wt 190.0 lb

## 2021-04-27 DIAGNOSIS — E291 Testicular hypofunction: Secondary | ICD-10-CM

## 2021-04-27 DIAGNOSIS — R6882 Decreased libido: Secondary | ICD-10-CM | POA: Diagnosis not present

## 2021-04-27 NOTE — Progress Notes (Signed)
04/27/2021 1:53 PM   Donald Tran 07/03/1971 [id number]  Referring provider: Ozella Rocks, MD [address] [place],  [place] [address]  Chief Complaint  Patient presents with   Hypogonadism    HPI: 50 y.o. male presents for follow-up of hypogonadism.  Symptoms tiredness, fatigue and decreased libido Initially started on testosterone gel and follow-up T level on replacement was 555 ng/dL He noted improvement in his energy level but libido remains decreased He requested to switch to Clomid and was started in November 2021 He decreased the dose to 12.5 mg daily due to increased anxiety and tachycardia on the higher dose Levels on Clomid have been in the 500 range and he has seen no improvement in his libido Labs 04/25/2021: Testosterone 531, PSA 2.1, hematocrit 48.1   PMH: Past Medical History:  Diagnosis Date   Chronic back pain    History of stomach ulcers    Hyperlipidemia    Kidney stones    OSA (obstructive sleep apnea)     Surgical History: Past Surgical History:  Procedure Laterality Date   COLONOSCOPY N/A 04/12/2021   Procedure: COLONOSCOPY;  Surgeon: Midge Minium, MD;  Location: [facility] ENDOSCOPY;  Service: Endoscopy;  Laterality: N/A;   facia release  Left    inspire implant     VASECTOMY      Home Medications:  Allergies as of 04/27/2021       Reactions   Diclofenac    Upset stomach        Medication List        Accurate as of April 27, 2021  1:53 PM. If you have any questions, ask your nurse or doctor.          Belsomra 5 MG Tabs Generic drug: Suvorexant Take by mouth.   cetirizine 10 MG tablet Commonly known as: ZYRTEC Take 10 mg by mouth daily.   Clenpiq 10-3.5-12 MG-GM -GM/160ML Soln Generic drug: Sod Picosulfate-Mag Ox-Cit Acd Take 320 mLs by mouth as directed.   clomiPHENE 50 MG tablet Commonly known as: CLOMID Take one half tab daily   eszopiclone 2 MG Tabs tablet Commonly known as: LUNESTA TAKE 1/2 TO 1  TABLET BY MOUTH AT BEDTIME IF NEEDED   gabapentin 300 MG capsule Commonly known as: NEURONTIN gabapentin 300 mg capsule  TAKE 1 CAPSULE BY MOUTH EVERY DAY AS NEEDED        Allergies:  Allergies  Allergen Reactions   Diclofenac     Upset stomach    Family History: Family History  Problem Relation Age of Onset   Kidney disease Neg Hx    Prostate cancer Neg Hx     Social History:  reports that he has never smoked. He has never used smokeless tobacco. He reports current alcohol use. He reports that he does not use drugs.   Physical Exam: BP 139/82   Pulse 83   Ht 5\' 9"  (1.753 m)   Wt 190 lb (86.2 kg)   BMI 28.06 kg/m   Constitutional:  Alert and oriented, No acute distress. HEENT: [place] AT, moist mucus membranes.  Trachea midline, no masses. Cardiovascular: No clubbing, cyanosis, or edema. Respiratory: Normal respiratory effort, no increased work of breathing.   Assessment & Plan:    1. Hypogonadism in male Testosterone level has been an acceptable range on both testosterone gel and Clomid without significant improvement in his libido He inquired if testosterone injections would make any difference and since his levels were within therapeutic range  on testosterone gel I informed him I do not feel this would make a difference He would like to come off Clomid and will recheck a testosterone level in approximately 3 months   Riki Altes, MD  [facility] [address], [address] [place], [place] [address] [phone]

## 2021-04-30 ENCOUNTER — Encounter: Payer: Self-pay | Admitting: Urology

## 2021-07-26 ENCOUNTER — Other Ambulatory Visit: Payer: 59

## 2021-07-26 ENCOUNTER — Other Ambulatory Visit: Payer: Self-pay

## 2021-07-26 DIAGNOSIS — E291 Testicular hypofunction: Secondary | ICD-10-CM

## 2021-07-27 ENCOUNTER — Other Ambulatory Visit: Payer: 59

## 2021-07-27 ENCOUNTER — Encounter: Payer: Self-pay | Admitting: Urology

## 2021-07-27 LAB — TESTOSTERONE: Testosterone: 396 ng/dL (ref 264–916)

## 2022-08-10 ENCOUNTER — Ambulatory Visit
Admission: EM | Admit: 2022-08-10 | Discharge: 2022-08-10 | Disposition: A | Discharge status: Home / Self Care | Payer: 59 | Attending: Urgent Care | Admitting: Urgent Care

## 2022-08-10 DIAGNOSIS — Z1152 Encounter for screening for COVID-19: Secondary | ICD-10-CM | POA: Diagnosis not present

## 2022-08-10 DIAGNOSIS — R6889 Other general symptoms and signs: Secondary | ICD-10-CM | POA: Insufficient documentation

## 2022-08-10 LAB — POCT RAPID STREP A (OFFICE): Rapid Strep A Screen: NEGATIVE

## 2022-08-10 MED ORDER — OSELTAMIVIR PHOSPHATE 75 MG PO CAPS
75.0000 mg | ORAL_CAPSULE | Freq: Two times a day (BID) | ORAL | 0 refills | Status: AC
Start: 2022-08-10 — End: ?

## 2022-08-10 NOTE — Discharge Instructions (Addendum)
You have been diagnosed with a viral upper respiratory infection based on your symptoms and exam. Viral illnesses cannot be treated with antibiotics - they are self limiting - and you should find your symptoms resolving within a few days. Get plenty of rest and non-caffeinated fluids. Watch for signs of dehydration including reduced urine output and dark colored urine.  We have performed a respiratory swab checking to rule out COVID.  Have ruled out strep already in clinic. I have prescribed Tamiflu, antiviral therapy for influenza A, based on a presumptive diagnosis of influenza.  Someone will contact you after results of your swab are available with instructions to continue or stop this medication.    We recommend you use over-the-counter medications for symptom control including Tylenol or ibuprofen for fever, chills or body aches, and cold/cough medication.  Saline mist spray is helpful for removing excess mucus from your nose.  Room humidifiers are helpful to ease breathing at night. I recommend guaifenesin (Mucinex) to help thin and loosen mucus secretions in your respiratory passages.   If appropriate based upon your other medical problems, you might also find relief of nasal/sinus congestion symptoms by using a nasal decongestant such as Flonase (fluticasone) or Sudafed sinus (pseudoephedrine).  You will need to obtain Sudafed from behind the pharmacist counter.  Speak to the pharmacist to verify that you are not duplicating medications with other over-the-counter formulations that you may be using.   Follow up here or with your primary care provider if your symptoms are worsening or not improving.

## 2022-08-10 NOTE — ED Triage Notes (Signed)
Pt. Presents to UC w/ c/o a sore throat that started yesterday. Pt. Also reports chills and increased  sweating that started last night.

## 2022-08-10 NOTE — ED Provider Notes (Signed)
[facility]    CSN: [id number] Arrival date & time: 08/10/22  0854      History   Chief Complaint Chief Complaint  Patient presents with   Sore Throat   Chills    HPI Donald Tran is a 51 y.o. male.    Sore Throat    presents to urgent care with sore throat starting yesterday.  He also reports chills and sweating last night.  No documented fever but he has been using ibuprofen for his throat pain.  Past Medical History:  Diagnosis Date   Chronic back pain    History of stomach ulcers    Hyperlipidemia    Kidney stones    OSA (obstructive sleep apnea)     Patient Active Problem List   Diagnosis Date Noted   Special screening for malignant neoplasms, colon    History of kidney stones 07/20/2015   Microscopic hematuria 07/20/2015   Right flank pain 07/20/2015   Insomnia 07/14/2015   OSA (obstructive sleep apnea) 10/21/2011    Past Surgical History:  Procedure Laterality Date   COLONOSCOPY N/A 04/12/2021   Procedure: COLONOSCOPY;  Surgeon: Midge Minium, MD;  Location: [facility] ENDOSCOPY;  Service: Endoscopy;  Laterality: N/A;   facia release  Left    inspire implant     VASECTOMY         Home Medications    Prior to Admission medications   Medication Sig Start Date End Date Taking? Authorizing Provider  Alirocumab (PRALUENT) 75 MG/ML SOAJ 1 MILLILITER EVERY 2 WEEKS 10/09/20  Yes [provider]  HYDROcodone-acetaminophen (NORCO/VICODIN) 5-325 MG tablet Take 1 tablet every 6 hours by oral route as needed. 08/07/22  Yes [provider]  benzonatate (TESSALON) 100 MG capsule 1-2 CAPSULES 3 TIMES A DAY AS NEEDED    [provider]  cetirizine (ZYRTEC) 10 MG tablet Take 10 mg by mouth daily.    [provider]  clomiPHENE (CLOMID) 50 MG tablet Take one half tab daily 06/30/20   Stoioff, Verna Czech, MD  escitalopram (LEXAPRO) 10 MG tablet Take 1 tablet by mouth daily.    [provider]  eszopiclone  (LUNESTA) 2 MG TABS tablet TAKE 1/2 TO 1 TABLET BY MOUTH AT BEDTIME IF NEEDED 12/15/17   [provider]  FLUoxetine (PROZAC) 10 MG capsule TAKE 1 CAPSULE BY MOUTH EVERYDAY AT BEDTIME    [provider]  gabapentin (NEURONTIN) 300 MG capsule gabapentin 300 mg capsule  TAKE 1 CAPSULE BY MOUTH EVERY DAY AS NEEDED    [provider]  REPATHA SURECLICK 140 MG/ML SOAJ 1 MILLILITER EVERY 2 WEEKS    [provider]  Sod Picosulfate-Mag Ox-Cit Acd (CLENPIQ) 10-3.5-12 MG-GM -GM/160ML SOLN Take 320 mLs by mouth as directed. 03/13/21   Midge Minium, MD  Suvorexant (BELSOMRA) 5 MG TABS Take by mouth.    [provider]    Family History Family History  Problem Relation Age of Onset   Kidney disease Neg Hx    Prostate cancer Neg Hx     Social History Social History   Tobacco Use   Smoking status: Never   Smokeless tobacco: Never  Vaping Use   Vaping Use: Never used  Substance Use Topics   Alcohol use: Yes    Alcohol/week: 0.0 standard drinks of alcohol    Comment: occ   Drug use: Never    Comment: past     Allergies   Diclofenac   Review of Systems Review of Systems  Physical Exam Triage Vital Signs ED Triage Vitals [08/10/22 1005]  Enc Vitals Group     BP (!) 137/93     Pulse Rate 76     Resp 18     Temp 98.6 F (37 C)     Temp src      SpO2 97 %     Weight      Height      Head Circumference      Peak Flow      Pain Score 0     Pain Loc      Pain Edu?      Excl. in [place]?    No data found.  Updated Vital Signs BP (!) 137/93   Pulse 76   Temp 98.6 F (37 C)   Resp 18   SpO2 97%   Visual Acuity Right Eye Distance:   Left Eye Distance:   Bilateral Distance:    Right Eye Near:   Left Eye Near:    Bilateral Near:     Physical Exam Vitals reviewed.  Constitutional:      Appearance: He is ill-appearing.  HENT:     Mouth/Throat:     Pharynx: Posterior oropharyngeal erythema present. No oropharyngeal exudate.      Tonsils: No tonsillar exudate. 1+ on the right. 1+ on the left.  Cardiovascular:     Rate and Rhythm: Normal rate and regular rhythm.     Heart sounds: Normal heart sounds.  Pulmonary:     Effort: Pulmonary effort is normal.     Breath sounds: Normal breath sounds.  Neurological:     General: No focal deficit present.     Mental Status: He is alert and oriented to person, place, and time.  Psychiatric:        Mood and Affect: Mood normal.        Behavior: Behavior normal.      UC Treatments / Results  Labs (all labs ordered are listed, but only abnormal results are displayed) Labs Reviewed - No data to display  EKG   Radiology No results found.  Procedures Procedures (including critical care time)  Medications Ordered in UC Medications - No data to display  Initial Impression / Assessment and Plan / UC Course  I have reviewed the triage vital signs and the nursing notes.  Pertinent labs & imaging results that were available during my care of the patient were reviewed by me and considered in my medical decision making (see chart for details).   Patient is afebrile here with recent ibuprofen. Satting well on room air. Overall is ill appearing, well hydrated, without respiratory distress. Pulmonary exam is unremarkable.  Lungs CTAB without wheezing, rhonchi, rales.  Pharynx is mildly erythematous with an area of erythema on his hard palate.  There is no peritonsillar exudates.  Rapid strep is negative. Will perform COVID swab to rule that out while treating presumptively for influenza with Tamiflu given he is within the treatment window.  If positive COVID, will instruct patient to stop taking Tamiflu.  Also recommending continued use of OTC medication for symptom control.    Final Clinical Impressions(s) / UC Diagnoses   Final diagnoses:  None   Discharge Instructions   None    ED Prescriptions   None    PDMP not reviewed this encounter.   Rose Phi, [facility] 08/10/22 1045

## 2022-08-11 LAB — SARS CORONAVIRUS 2 (TAT 6-24 HRS): SARS Coronavirus 2: NEGATIVE

## 2024-07-08 ENCOUNTER — Other Ambulatory Visit (HOSPITAL_COMMUNITY): Payer: Self-pay | Admitting: Specialist

## 2024-07-08 DIAGNOSIS — M5459 Other low back pain: Secondary | ICD-10-CM

## 2024-07-08 DIAGNOSIS — M25561 Pain in right knee: Secondary | ICD-10-CM

## 2024-07-21 ENCOUNTER — Ambulatory Visit (HOSPITAL_COMMUNITY)
Admission: RE | Admit: 2024-07-21 | Discharge: 2024-07-21 | Disposition: A | Source: Ambulatory Visit | Attending: Specialist | Admitting: Specialist

## 2024-07-21 DIAGNOSIS — M5459 Other low back pain: Secondary | ICD-10-CM | POA: Insufficient documentation

## 2024-07-21 DIAGNOSIS — M25561 Pain in right knee: Secondary | ICD-10-CM | POA: Diagnosis present
